# Patient Record
Sex: Male | Born: 1975 | Race: Black or African American | Hispanic: No | Marital: Married | Smoking: Never smoker
Health system: Southern US, Community
[De-identification: ages and names within clinical notes are randomized; demographics above are authoritative.]

## PROBLEM LIST (undated history)

## (undated) DIAGNOSIS — I5031 Acute diastolic (congestive) heart failure: Secondary | ICD-10-CM

## (undated) DIAGNOSIS — E8770 Fluid overload, unspecified: Secondary | ICD-10-CM

## (undated) DIAGNOSIS — G4730 Sleep apnea, unspecified: Secondary | ICD-10-CM

## (undated) DIAGNOSIS — E079 Disorder of thyroid, unspecified: Secondary | ICD-10-CM

## (undated) DIAGNOSIS — K219 Gastro-esophageal reflux disease without esophagitis: Secondary | ICD-10-CM

## (undated) DIAGNOSIS — I509 Heart failure, unspecified: Secondary | ICD-10-CM

## (undated) DIAGNOSIS — I10 Essential (primary) hypertension: Secondary | ICD-10-CM

## (undated) DIAGNOSIS — R011 Cardiac murmur, unspecified: Secondary | ICD-10-CM

## (undated) DIAGNOSIS — I519 Heart disease, unspecified: Secondary | ICD-10-CM

## (undated) HISTORY — DX: Heart failure, unspecified: I50.9

## (undated) HISTORY — DX: Sleep apnea, unspecified: G47.30

## (undated) HISTORY — DX: Cardiac murmur, unspecified: R01.1

## (undated) HISTORY — PX: OTHER SURGICAL HISTORY: SHX169

## (undated) HISTORY — DX: Fluid overload, unspecified: E87.70

## (undated) HISTORY — DX: Gastro-esophageal reflux disease without esophagitis: K21.9

## (undated) HISTORY — DX: Acute diastolic (congestive) heart failure: I50.31

## (undated) HISTORY — DX: Heart disease, unspecified: I51.9

---

## 1998-07-17 ENCOUNTER — Emergency Department (HOSPITAL_COMMUNITY): Admission: EM | Admit: 1998-07-17 | Discharge: 1998-07-17 | Payer: Self-pay | Admitting: Emergency Medicine

## 1999-08-21 ENCOUNTER — Encounter: Payer: Self-pay | Admitting: Emergency Medicine

## 1999-08-21 ENCOUNTER — Emergency Department (HOSPITAL_COMMUNITY): Admission: EM | Admit: 1999-08-21 | Discharge: 1999-08-21 | Payer: Self-pay | Admitting: Emergency Medicine

## 1999-08-23 ENCOUNTER — Emergency Department (HOSPITAL_COMMUNITY): Admission: EM | Admit: 1999-08-23 | Discharge: 1999-08-23 | Payer: Self-pay | Admitting: Emergency Medicine

## 1999-09-02 ENCOUNTER — Emergency Department (HOSPITAL_COMMUNITY): Admission: EM | Admit: 1999-09-02 | Discharge: 1999-09-02 | Payer: Self-pay | Admitting: Emergency Medicine

## 2006-03-31 ENCOUNTER — Emergency Department (HOSPITAL_COMMUNITY): Admission: EM | Admit: 2006-03-31 | Discharge: 2006-03-31 | Payer: Self-pay | Admitting: Emergency Medicine

## 2010-12-05 ENCOUNTER — Encounter: Payer: Self-pay | Admitting: Internal Medicine

## 2011-01-26 ENCOUNTER — Emergency Department (HOSPITAL_COMMUNITY)
Admission: EM | Admit: 2011-01-26 | Discharge: 2011-01-26 | Disposition: A | Discharge status: Home / Self Care | Payer: Worker's Compensation | Attending: Emergency Medicine | Admitting: Emergency Medicine

## 2011-01-26 ENCOUNTER — Emergency Department (HOSPITAL_COMMUNITY): Payer: Worker's Compensation

## 2011-01-26 DIAGNOSIS — Y990 Civilian activity done for income or pay: Secondary | ICD-10-CM | POA: Insufficient documentation

## 2011-01-26 DIAGNOSIS — Y9269 Other specified industrial and construction area as the place of occurrence of the external cause: Secondary | ICD-10-CM | POA: Insufficient documentation

## 2011-01-26 DIAGNOSIS — W208XXA Other cause of strike by thrown, projected or falling object, initial encounter: Secondary | ICD-10-CM | POA: Insufficient documentation

## 2011-01-26 DIAGNOSIS — S90129A Contusion of unspecified lesser toe(s) without damage to nail, initial encounter: Secondary | ICD-10-CM | POA: Insufficient documentation

## 2014-07-17 ENCOUNTER — Other Ambulatory Visit (HOSPITAL_COMMUNITY): Payer: Self-pay | Admitting: Internal Medicine

## 2016-03-21 ENCOUNTER — Emergency Department (HOSPITAL_COMMUNITY): Payer: Medicaid Other

## 2016-03-21 ENCOUNTER — Inpatient Hospital Stay (HOSPITAL_COMMUNITY)
Admission: EM | Admit: 2016-03-21 | Discharge: 2016-03-26 | DRG: 308 | Disposition: A | Discharge status: Home / Self Care | Payer: Medicaid Other | Attending: Internal Medicine | Admitting: Internal Medicine

## 2016-03-21 ENCOUNTER — Inpatient Hospital Stay (HOSPITAL_COMMUNITY): Payer: Medicaid Other

## 2016-03-21 ENCOUNTER — Encounter (HOSPITAL_COMMUNITY): Payer: Self-pay | Admitting: Emergency Medicine

## 2016-03-21 ENCOUNTER — Encounter (HOSPITAL_COMMUNITY): Payer: Self-pay | Admitting: *Deleted

## 2016-03-21 ENCOUNTER — Ambulatory Visit (HOSPITAL_COMMUNITY)
Admission: EM | Admit: 2016-03-21 | Discharge: 2016-03-21 | Disposition: A | Discharge status: Home / Self Care | Payer: Self-pay | Attending: Emergency Medicine | Admitting: Emergency Medicine

## 2016-03-21 DIAGNOSIS — Z9114 Patient's other noncompliance with medication regimen: Secondary | ICD-10-CM

## 2016-03-21 DIAGNOSIS — E213 Hyperparathyroidism, unspecified: Secondary | ICD-10-CM | POA: Diagnosis present

## 2016-03-21 DIAGNOSIS — E0590 Thyrotoxicosis, unspecified without thyrotoxic crisis or storm: Secondary | ICD-10-CM | POA: Diagnosis present

## 2016-03-21 DIAGNOSIS — I5033 Acute on chronic diastolic (congestive) heart failure: Secondary | ICD-10-CM | POA: Diagnosis not present

## 2016-03-21 DIAGNOSIS — R0602 Shortness of breath: Secondary | ICD-10-CM

## 2016-03-21 DIAGNOSIS — I480 Paroxysmal atrial fibrillation: Secondary | ICD-10-CM | POA: Diagnosis present

## 2016-03-21 DIAGNOSIS — I10 Essential (primary) hypertension: Secondary | ICD-10-CM | POA: Insufficient documentation

## 2016-03-21 DIAGNOSIS — E876 Hypokalemia: Secondary | ICD-10-CM | POA: Diagnosis not present

## 2016-03-21 DIAGNOSIS — I5022 Chronic systolic (congestive) heart failure: Secondary | ICD-10-CM | POA: Diagnosis not present

## 2016-03-21 DIAGNOSIS — I481 Persistent atrial fibrillation: Secondary | ICD-10-CM | POA: Diagnosis not present

## 2016-03-21 DIAGNOSIS — R17 Unspecified jaundice: Secondary | ICD-10-CM | POA: Diagnosis present

## 2016-03-21 DIAGNOSIS — I5043 Acute on chronic combined systolic (congestive) and diastolic (congestive) heart failure: Secondary | ICD-10-CM | POA: Diagnosis present

## 2016-03-21 DIAGNOSIS — I509 Heart failure, unspecified: Secondary | ICD-10-CM

## 2016-03-21 DIAGNOSIS — I4891 Unspecified atrial fibrillation: Secondary | ICD-10-CM | POA: Diagnosis not present

## 2016-03-21 DIAGNOSIS — I5040 Unspecified combined systolic (congestive) and diastolic (congestive) heart failure: Secondary | ICD-10-CM | POA: Diagnosis not present

## 2016-03-21 DIAGNOSIS — E8770 Fluid overload, unspecified: Secondary | ICD-10-CM | POA: Diagnosis not present

## 2016-03-21 DIAGNOSIS — Z8679 Personal history of other diseases of the circulatory system: Secondary | ICD-10-CM | POA: Diagnosis present

## 2016-03-21 DIAGNOSIS — I5031 Acute diastolic (congestive) heart failure: Secondary | ICD-10-CM | POA: Diagnosis not present

## 2016-03-21 DIAGNOSIS — I519 Heart disease, unspecified: Secondary | ICD-10-CM

## 2016-03-21 DIAGNOSIS — I110 Hypertensive heart disease with heart failure: Secondary | ICD-10-CM | POA: Diagnosis present

## 2016-03-21 DIAGNOSIS — R079 Chest pain, unspecified: Secondary | ICD-10-CM

## 2016-03-21 HISTORY — DX: Heart failure, unspecified: I50.9

## 2016-03-21 HISTORY — DX: Disorder of thyroid, unspecified: E07.9

## 2016-03-21 HISTORY — DX: Essential (primary) hypertension: I10

## 2016-03-21 LAB — CBC WITH DIFFERENTIAL/PLATELET
Basophils Absolute: 0 10*3/uL (ref 0.0–0.1)
Basophils Relative: 0 %
EOS PCT: 1 %
Eosinophils Absolute: 0 10*3/uL (ref 0.0–0.7)
HEMATOCRIT: 36.8 % — AB (ref 39.0–52.0)
Hemoglobin: 12.5 g/dL — ABNORMAL LOW (ref 13.0–17.0)
LYMPHS ABS: 1.9 10*3/uL (ref 0.7–4.0)
LYMPHS PCT: 23 %
MCH: 24.6 pg — AB (ref 26.0–34.0)
MCHC: 34 g/dL (ref 30.0–36.0)
MCV: 72.3 fL — AB (ref 78.0–100.0)
Monocytes Absolute: 1.3 10*3/uL — ABNORMAL HIGH (ref 0.1–1.0)
Monocytes Relative: 15 %
NEUTROS ABS: 5.1 10*3/uL (ref 1.7–7.7)
Neutrophils Relative %: 61 %
PLATELETS: 187 10*3/uL (ref 150–400)
RBC: 5.09 MIL/uL (ref 4.22–5.81)
RDW: 16.1 % — ABNORMAL HIGH (ref 11.5–15.5)
WBC: 8.3 10*3/uL (ref 4.0–10.5)

## 2016-03-21 LAB — COMPREHENSIVE METABOLIC PANEL
ALK PHOS: 223 U/L — AB (ref 38–126)
ALT: 24 U/L (ref 17–63)
AST: 28 U/L (ref 15–41)
Albumin: 3.2 g/dL — ABNORMAL LOW (ref 3.5–5.0)
Anion gap: 11 (ref 5–15)
BILIRUBIN TOTAL: 4.7 mg/dL — AB (ref 0.3–1.2)
BUN: 9 mg/dL (ref 6–20)
CALCIUM: 8.8 mg/dL — AB (ref 8.9–10.3)
CHLORIDE: 106 mmol/L (ref 101–111)
CO2: 21 mmol/L — ABNORMAL LOW (ref 22–32)
CREATININE: 0.72 mg/dL (ref 0.61–1.24)
Glucose, Bld: 107 mg/dL — ABNORMAL HIGH (ref 65–99)
Potassium: 3.4 mmol/L — ABNORMAL LOW (ref 3.5–5.1)
Sodium: 138 mmol/L (ref 135–145)
TOTAL PROTEIN: 7.2 g/dL (ref 6.5–8.1)

## 2016-03-21 LAB — I-STAT TROPONIN, ED: TROPONIN I, POC: 0.01 ng/mL (ref 0.00–0.08)

## 2016-03-21 LAB — TSH: TSH: <0.01 u[IU]/mL — ABNORMAL LOW (ref 0.350–4.500)

## 2016-03-21 LAB — PROTIME-INR
INR: 1.46 (ref 0.00–1.49)
Prothrombin Time: 17.8 seconds — ABNORMAL HIGH (ref 11.6–15.2)

## 2016-03-21 LAB — CK: Total CK: 63 U/L (ref 49–397)

## 2016-03-21 LAB — BRAIN NATRIURETIC PEPTIDE: B Natriuretic Peptide: 279.4 pg/mL — ABNORMAL HIGH (ref 0.0–100.0)

## 2016-03-21 LAB — TROPONIN I

## 2016-03-21 MED ORDER — METHIMAZOLE 10 MG PO TABS
10.0000 mg | ORAL_TABLET | Freq: Every day | ORAL | Status: DC
  Administered 2016-03-22 – 2016-03-26 (×5): 10 mg via ORAL
  Filled 2016-03-21 (×5): qty 1

## 2016-03-21 MED ORDER — ONDANSETRON HCL 4 MG/2ML IJ SOLN: 4.0000 mg | Freq: Four times a day (QID) | INTRAMUSCULAR | Status: DC | PRN

## 2016-03-21 MED ORDER — DILTIAZEM HCL 100 MG IV SOLR
5.0000 mg/h | Freq: Once | INTRAVENOUS | Status: AC
  Administered 2016-03-21: 5 mg/h via INTRAVENOUS
  Filled 2016-03-21: qty 100

## 2016-03-21 MED ORDER — DILTIAZEM HCL 25 MG/5ML IV SOLN
20.0000 mg | Freq: Once | INTRAVENOUS | Status: AC
  Administered 2016-03-21: 20 mg via INTRAVENOUS
  Filled 2016-03-21: qty 5

## 2016-03-21 MED ORDER — SODIUM CHLORIDE 0.9% FLUSH
3.0000 mL | Freq: Two times a day (BID) | INTRAVENOUS | Status: DC
  Administered 2016-03-22 – 2016-03-26 (×7): 3 mL via INTRAVENOUS

## 2016-03-21 MED ORDER — SODIUM CHLORIDE 0.9% FLUSH: 3.0000 mL | INTRAVENOUS | Status: DC | PRN

## 2016-03-21 MED ORDER — DILTIAZEM HCL 100 MG IV SOLR
5.0000 mg/h | INTRAVENOUS | Status: DC
  Administered 2016-03-22 (×2): 15 mg/h via INTRAVENOUS
  Filled 2016-03-21 (×2): qty 100

## 2016-03-21 MED ORDER — SODIUM CHLORIDE 0.9 % IV SOLN: 250.0000 mL | INTRAVENOUS | Status: DC | PRN

## 2016-03-21 MED ORDER — FUROSEMIDE 10 MG/ML IJ SOLN
40.0000 mg | Freq: Two times a day (BID) | INTRAMUSCULAR | Status: DC
  Administered 2016-03-22 – 2016-03-24 (×5): 40 mg via INTRAVENOUS
  Filled 2016-03-21 (×5): qty 4

## 2016-03-21 MED ORDER — SODIUM CHLORIDE 0.9 % IV BOLUS (SEPSIS): 1000.0000 mL | Freq: Once | INTRAVENOUS | Status: DC

## 2016-03-21 MED ORDER — ATENOLOL 25 MG PO TABS
25.0000 mg | ORAL_TABLET | Freq: Every day | ORAL | Status: DC
  Administered 2016-03-22: 25 mg via ORAL
  Filled 2016-03-21: qty 1

## 2016-03-21 MED ORDER — ACETAMINOPHEN 325 MG PO TABS: 650.0000 mg | ORAL_TABLET | ORAL | Status: DC | PRN

## 2016-03-21 MED ORDER — RAMIPRIL 1.25 MG PO CAPS
1.2500 mg | ORAL_CAPSULE | Freq: Every day | ORAL | Status: DC
  Administered 2016-03-23 – 2016-03-26 (×4): 1.25 mg via ORAL
  Filled 2016-03-21 (×5): qty 1

## 2016-03-21 NOTE — ED Provider Notes (Signed)
CSN: [id number]     Arrival date & time 03/21/16  1654 History   None    Chief Complaint  Patient presents with  . Cough   (Consider location/radiation/quality/duration/timing/severity/associated sxs/prior Treatment) HPI Comments: Patient has run out of thyroid meds for hyperthyroidism a month ago and he is having SOB, tacycardia, swelling in lower extremities, chest pain, and weakness.  Patient is a 40 y.o. male presenting with cough. The history is provided by the patient.  Cough Cough characteristics:  Hacking Severity:  Moderate Onset quality:  Sudden Duration:  1 day Timing:  Constant Progression:  Worsening Chronicity:  New Relieved by:  Nothing Worsened by:  Nothing tried Ineffective treatments:  None tried Associated symptoms: chest pain     History reviewed. No pertinent past medical history. History reviewed. No pertinent past surgical history. No family history on file. Social History  Substance Use Topics  . Smoking status: None  . Smokeless tobacco: None  . Alcohol Use: None    Review of Systems  Constitutional: Negative.   HENT: Negative.   Eyes: Negative.   Respiratory: Positive for cough.   Cardiovascular: Positive for chest pain.  Gastrointestinal: Negative.   Endocrine: Negative.   Genitourinary: Negative.   Musculoskeletal: Negative.   Skin: Negative.   Allergic/Immunologic: Negative.   Neurological: Negative.   Hematological: Negative.   Psychiatric/Behavioral: Negative.     Allergies  Review of patient's allergies indicates no known allergies.  Home Medications   Prior to Admission medications   Medication Sig Start Date End Date Taking? Authorizing Provider  ATENOLOL PO Take by mouth.   Yes Historical Provider, MD   Meds Ordered and Administered this Visit  Medications - No data to display  There were no vitals taken for this visit. No data found.   Physical Exam  Constitutional: He appears well-developed and well-nourished.   HENT:  Head: Normocephalic and atraumatic.  Eyes: Conjunctivae are normal. Pupils are equal, round, and reactive to light.  Neck:  Bilateral JVD 1/2 way up  Cardiovascular:  Tachycardia  Pulmonary/Chest: He is in respiratory distress.  Abdominal: Soft.    ED Course  Procedures (including critical care time)  Labs Review Labs Reviewed - No data to display  Imaging Review No results found.   Visual Acuity Review  Right Eye Distance:   Left Eye Distance:   Bilateral Distance:    Right Eye Near:   Left Eye Near:    Bilateral Near:         MDM  Atrial Fibrillation with rapid ventricular rate - Patient is symptomatic and SOB.  Will add nasal cannula  Oxygen, apply monitor, IV access and transfer to ED.  Chest pain - Transfer to ED via ambulance ASAP    Deatra CanterWilliam J Hilaria Titsworth, FNP 03/21/16 1939  Deatra CanterWilliam J Maryjane Benedict, FNP 03/21/16 1940

## 2016-03-21 NOTE — ED Notes (Addendum)
Pt  Reports    Cough  Congested   Swelling  Of  The  Left  Foot   Pt  Reports  Get  Short  Of  Breath  When   On  Exertion  Pt  Reports    Pt  States         Pt  states     He  Is  Out  Of  His  thyriod          meds   And         Needs  thyriod  Surgery   PT  Reports  Some  jvd   Distension

## 2016-03-21 NOTE — ED Notes (Signed)
Patient denies any dizziness, chest pain, or sob. Pt aware of poc. HR 160s afib. Patient on monitor.

## 2016-03-21 NOTE — ED Notes (Addendum)
Pt went to urgent care today c/o SOB, pt reports he has been out of his hyperthyroid medication for a month.  He came in A-fib w/ RVR, some pedal edema.  At this time he denies SOB, N/V, dizziness.  SOB upon exertion.  Reports loose stool 2X weeks

## 2016-03-21 NOTE — ED Notes (Signed)
Admitting MD at the bedside.  

## 2016-03-21 NOTE — ED Provider Notes (Signed)
CSN: [id number]     Arrival date & time 03/21/16  2000 History   First MD Initiated Contact with Patient 03/21/16 2007     Chief Complaint  Patient presents with  . Shortness of Breath     (Consider location/radiation/quality/duration/timing/severity/associated sxs/prior Treatment) The history is provided by the patient.  KEELAN TRIPODI is a 40 y.o. male hx of hyperthyroidism, HTN here with shortness of breath, palpitations. Patient has not been taking his methimazole and atenolol for the last month or so. About a month ago he had an episode of palpitations have resolved. For the last week, he's been having constant palpitations and shortness of breath. Palpitations is worse when he exerts himself. Denies any chest pain at rest. Also has loose stools for 2 weeks, denies watery diarrhea or abdominal pain or fevers. Also has worsening bilateral leg swelling as well. Went to urgent care, sent for new onset afib.    Past Medical History  Diagnosis Date  . Thyroid disease   . Hypertension    History reviewed. No pertinent past surgical history. History reviewed. No pertinent family history. Social History  Substance Use Topics  . Smoking status: Never Smoker   . Smokeless tobacco: None  . Alcohol Use: No    Review of Systems  Respiratory: Positive for shortness of breath.   Cardiovascular: Positive for palpitations.  All other systems reviewed and are negative.     Allergies  Review of patient's allergies indicates no known allergies.  Home Medications   Prior to Admission medications   Medication Sig Start Date End Date Taking? Authorizing Provider  atenolol (TENORMIN) 25 MG tablet Take 25 mg by mouth daily.   Yes Historical Provider, MD  ibuprofen (ADVIL,MOTRIN) 200 MG tablet Take 200 mg by mouth every 6 (six) hours as needed for headache.   Yes Historical Provider, MD  methimazole (TAPAZOLE) 10 MG tablet Take 10 mg by mouth daily.   Yes Historical Provider, MD   Phenylephrine-APAP-Guaifenesin (MUCINEX FAST-MAX) 10-650-400 MG/20ML LIQD Take 30 mLs by mouth daily as needed (FOR COLD).   Yes Historical Provider, MD   BP 163/97 mmHg  Pulse 112  Resp 30  SpO2 98% Physical Exam  Constitutional: He is oriented to person, place, and time.  Uncomfortable   HENT:  Head: Normocephalic.  Mouth/Throat: Oropharynx is clear and moist.  Eyes: Conjunctivae are normal. Pupils are equal, round, and reactive to light.  Neck: Normal range of motion. Neck supple.  Cardiovascular:  Tachy, irregular   Pulmonary/Chest: Effort normal and breath sounds normal. No respiratory distress. He has no wheezes. He has no rales.  Abdominal: Soft. Bowel sounds are normal. He exhibits no distension. There is no tenderness. There is no rebound.  Musculoskeletal: Normal range of motion.  1+ edema bilaterally, no calf tenderness   Neurological: He is alert and oriented to person, place, and time.  Skin: Skin is warm and dry.  Psychiatric: He has a normal mood and affect. His behavior is normal. Judgment and thought content normal.  Nursing note and vitals reviewed.   ED Course  Procedures (including critical care time)  CRITICAL CARE Performed by: Darl Householder, Deyton Ellenbecker   Total critical care time: 30 minutes  Critical care time was exclusive of separately billable procedures and treating other patients.  Critical care was necessary to treat or prevent imminent or life-threatening deterioration.  Critical care was time spent personally by me on the following activities: development of treatment plan with patient and/or surrogate as well as nursing,  discussions with consultants, evaluation of patient's response to treatment, examination of patient, obtaining history from patient or surrogate, ordering and performing treatments and interventions, ordering and review of laboratory studies, ordering and review of radiographic studies, pulse oximetry and re-evaluation of patient's  condition.   Labs Review Labs Reviewed  CBC WITH DIFFERENTIAL/PLATELET - Abnormal; Notable for the following:    Hemoglobin 12.5 (*)    HCT 36.8 (*)    MCV 72.3 (*)    MCH 24.6 (*)    RDW 16.1 (*)    Monocytes Absolute 1.3 (*)    All other components within normal limits  COMPREHENSIVE METABOLIC PANEL - Abnormal; Notable for the following:    Potassium 3.4 (*)    CO2 21 (*)    Glucose, Bld 107 (*)    Calcium 8.8 (*)    Albumin 3.2 (*)    Alkaline Phosphatase 223 (*)    Total Bilirubin 4.7 (*)    All other components within normal limits  BRAIN NATRIURETIC PEPTIDE - Abnormal; Notable for the following:    B Natriuretic Peptide 279.4 (*)    All other components within normal limits  TSH  CK  PROTIME-INR  I-STAT TROPOININ, ED    Imaging Review No results found. I have personally reviewed and evaluated these images and lab results as part of my medical decision-making.   EKG Interpretation   Date/Time:  Monday Mar 21 2016 20:03:34 EDT Ventricular Rate:  166 PR Interval:    QRS Duration: 92 QT Interval:  277 QTC Calculation: 460 R Axis:   13 Text Interpretation:  Atrial fibrillation Paired ventricular premature  complexes Consider anterior infarct Nonspecific T abnormalities, lateral  leads rapid afib new since previous  Confirmed by Barak Bialecki  MD, Wessley Emert ([id number])  on 03/21/2016 8:13:14 PM      MDM   Final diagnoses:  Elevated bilirubin   ULISES WOLFINGER is a 40 y.o. male here with palpitations, shortness of breath. Has new onset afib likely from uncompliance from his thyroid medication. Came in rapid afib rate 150-180s. Will give cardizem bolus and reassess. Also can be in heart failure so will get BNP and TSH as well.    9:45 PM Given cardizem bolus, still tachy in 140s. Started cardizem drip. Labs showed BNP 279. Also elevated bilirubin and Alk Phos. Hospitalist wants RUQ [place]. Will admit to stepdown.     Wandra Arthurs, MD 03/21/16 2145

## 2016-03-21 NOTE — ED Notes (Signed)
PLACED  ON  CARDIAC    MONITOR   NASAL  O2  AT  2  L  /  MIN

## 2016-03-21 NOTE — H&P (Signed)
Troy Morris UJW:[id number] DOB: 1976/04/10 DOA: 03/21/2016   Referring MD Silverio LayYao PCP: Lonia BloodGARBA,LAWAL, MD   Outpatient Specialists: none Patient coming from: home  Chief Complaint: Palpitations  HPI: Troy Morris is a 40 y.o. male with medical history significant of hyperthyroidism    Presented with 1 day of cough hacking and 2-3 of  palpitations lasting for the past  Week getting continuous now. Associated shortness of breath and dyspnea on exertion. Reports orthopnea  and bilateral lower extremity swelling worse since yesterday this has been associated with some chest pain that started 3 days ago coming and going sharp pain radiating to left arm pain in small area of the left chest. 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 Today presented to urgent care was found to be tachycardic and sent to  emergency department. Denies alcohol abuse, no use of tylenol he have used some ibuprofen. Denies any fever or chills now but in march he had some fevers. Reports diarrhea for 2 weeks and loosing body mass on upper body but increased fluids on the legs   Regarding pertinent Chronic problems: History of hyperparathyroidism used on methemazole and atenolol but run out of his medications.    IN ER: Heart rate 155 respirations 30 WBC 8.3 hemoglobin 12.5 potassium 3.4 INR 1.46 and PO2 79 TSH less than 0.01. Elevated 5.7 alkaline phosphatase 223 albumin 3.2 AST and ALT within normal limits  EKG showed atrial fibrillation with RVR  Afebrile initial heart rate up to 155 was given a dose of diltiazem IV and  started on diltiazem drip.  Chest XA showing mild congestive heart failure with small bilateral pleural effusions   Hospitalist was called for admission for atrial fibrillation with RVR associated with heart failure active secondary to persistent tachycardia secondary to untreated hyperthyroidism  Review of Systems:     Pertinent positives include: fatigue, weight gain, chest pain, Orthopnea, Bilateral lower extremity swelling   Constitutional:  No weight loss, night sweats, Fevers, chills,  weight loss  HEENT:  No headaches, Difficulty swallowing,Tooth/dental problems,Sore throat,  No sneezing, itching, ear ache, nasal congestion, post nasal drip,  Cardio-vascular:  No PND, anasarca, dizziness, palpitations  GI:  No heartburn, indigestion, abdominal pain, nausea, vomiting, diarrhea, change in bowel habits, loss of appetite, melena, blood in stool, hematemesis Resp:  no shortness of breath at rest. No dyspnea on exertion, No excess mucus, no productive cough, No non-productive cough, No coughing up of blood.No change in color of mucus.No wheezing. Skin:  no rash or lesions. No jaundice GU:  no dysuria, change in color of urine, no urgency or frequency. No straining to urinate.  No flank pain.  Musculoskeletal:  No joint pain or no joint swelling. No decreased range of motion. No back pain.  Psych:  No change in mood or affect. No depression or anxiety. No memory loss.  Neuro: no localizing neurological complaints, no tingling, no weakness, no double vision, no gait abnormality, no slurred speech, no confusion  As per HPI otherwise 10 point review of systems negative.   Past Medical History: Past Medical History  Diagnosis Date  . Thyroid disease   . Hypertension    History reviewed. No pertinent past surgical history.   Social History:  Ambulatory   Independently  Lives at home   With family     reports that he has never smoked. He does not have any smokeless  tobacco history on file. He reports that he does not drink alcohol or use illicit drugs.  Allergies:  No Known Allergies     Family History:    Family History  Problem Relation Age of Onset  . Diabetes Mother   . CAD Neg Hx   . Stroke Neg Hx   . Cancer Neg Hx   . Clotting disorder Neg Hx     Medications: Prior to  Admission medications   Medication Sig Start Date End Date Taking? Authorizing Provider  atenolol (TENORMIN) 25 MG tablet Take 25 mg by mouth daily.   Yes Historical Provider, MD  ibuprofen (ADVIL,MOTRIN) 200 MG tablet Take 200 mg by mouth every 6 (six) hours as needed for headache.   Yes Historical Provider, MD  methimazole (TAPAZOLE) 10 MG tablet Take 10 mg by mouth daily.   Yes Historical Provider, MD  Phenylephrine-APAP-Guaifenesin (MUCINEX FAST-MAX) 10-650-400 MG/20ML LIQD Take 30 mLs by mouth daily as needed (FOR COLD).   Yes Historical Provider, MD    Physical Exam: Patient Vitals for the past 24 hrs:  BP Pulse Resp SpO2  03/21/16 2100 163/97 mmHg 112 (!) 30 98 %  03/21/16 2045 (!) 159/116 mmHg 103 (!) 30 99 %  03/21/16 2030 (!) 153/111 mmHg 120 26 97 %  03/21/16 2011 (!) 155/106 mmHg (!) 130 21 100 %  03/21/16 2005 - - - 100 %    1. General:  in No Acute distress 2. Psychological: Alert and   Oriented 3. Head/ENT:    Dry Mucous Membranes                          Head Non traumatic, neck supple                          Normal   Dentition 4. SKIN:  decreased Skin turgor,  Skin clean Dry and intact no rash, hot 5. Heart: Regular rate and rhythm no  Murmur, Rub or gallop 6. Lungs:   no wheezes mild crackles   7. Abdomen: Soft, non-tender, Non distended 8. Lower extremities: no clubbing, cyanosis, 2+ edema bilateral 9. Neurologically Grossly intact, moving all 4 extremities equally 10. MSK: Normal range of motion   body mass index is unknown because there is no height or weight on file.  Labs on Admission:   Labs on Admission: I have personally reviewed following labs and imaging studies  CBC:  Recent Labs Lab 03/21/16 2019  WBC 8.3  NEUTROABS 5.1  HGB 12.5*  HCT 36.8*  MCV 72.3*  PLT 187   Basic Metabolic Panel:  Recent Labs Lab 03/21/16 2019  NA 138  K 3.4*  CL 106  CO2 21*  GLUCOSE 107*  BUN 9  CREATININE 0.72  CALCIUM 8.8*   GFR: CrCl cannot be  calculated (Unknown ideal weight.). Liver Function Tests:  Recent Labs Lab 03/21/16 2019  AST 28  ALT 24  ALKPHOS 223*  BILITOT 4.7*  PROT 7.2  ALBUMIN 3.2*   No results for input(s): LIPASE, AMYLASE in the last 168 hours. No results for input(s): AMMONIA in the last 168 hours. Coagulation Profile: No results for input(s): INR, PROTIME in the last 168 hours. Cardiac Enzymes: No results for input(s): CKTOTAL, CKMB, CKMBINDEX, TROPONINI in the last 168 hours. BNP (last 3 results) No results for input(s): PROBNP in the last 8760 hours. HbA1C: No results for input(s): HGBA1C in the last 72 hours.  CBG: No results for input(s): GLUCAP in the last 168 hours. Lipid Profile: No results for input(s): CHOL, HDL, LDLCALC, TRIG, CHOLHDL, LDLDIRECT in the last 72 hours. Thyroid Function Tests: No results for input(s): TSH, T4TOTAL, FREET4, T3FREE, THYROIDAB in the last 72 hours. Anemia Panel: No results for input(s): VITAMINB12, FOLATE, FERRITIN, TIBC, IRON, RETICCTPCT in the last 72 hours. Urine analysis: No results found for: COLORURINE, APPEARANCEUR, LABSPEC, PHURINE, GLUCOSEU, HGBUR, BILIRUBINUR, KETONESUR, PROTEINUR, UROBILINOGEN, NITRITE, LEUKOCYTESUR Sepsis Labs: @LABRCNTIP (procalcitonin:4,lacticidven:4) )No results found for this or any previous visit (from the past 240 hour(s)).     UA not obtained  No results found for: HGBA1C  CrCl cannot be calculated (Unknown ideal weight.).  BNP (last 3 results) No results for input(s): PROBNP in the last 8760 hours.   ECG REPORT  Independently reviewed Rate: 175  Rhythm: a.fib w RVR ST&T Change: No acute ischemic changes   QTC 494  There were no vitals filed for this visit.   Cultures: No results found for: SDES, SPECREQUEST, CULT, REPTSTATUS   Radiological Exams on Admission: Dg Chest Port 1 View  03/21/2016  CLINICAL DATA:  Shortness breath and tachycardia for 1 day. Atrial fibrillation with rapid ventricular response.  EXAM: PORTABLE CHEST 1 VIEW COMPARISON:  None. FINDINGS: Moderate to severe cardiomegaly noted. Mild diffuse interstitial infiltrates seen, consistent with interstitial edema. No focal consolidation identified. Small bilateral pleural effusions also noted. IMPRESSION: Mild congestive heart failure with small bilateral pleural effusions. Electronically Signed   By: Myles Rosenthal M.D.   On: 03/21/2016 21:43   US Abdomen Limited Ruq  03/21/2016  CLINICAL DATA:  40 year old male with elevated bilirubin EXAM: US ABDOMEN LIMITED - RIGHT UPPER QUADRANT COMPARISON:  None. FINDINGS: Gallbladder: No gallstones or wall thickening visualized. No sonographic Murphy sign noted by sonographer. Common bile duct: Diameter: 3 mm Liver: Minimal increased hepatic echotexture. A right-sided pleural effusions partially visualized. IMPRESSION: Minimally increased hepatic echotexture otherwise unremarkable right upper quadrant ultrasound. Partially visualized right pleural effusion. Electronically Signed   By: Elgie Collard M.D.   On: 03/21/2016 22:22    Chart has been reviewed    Assessment/Plan  40 y.o. male with medical history significant of hyperthyroidism being admited for atrial fibrillation with RVR associated with heart failure active secondary to persistent tachycardia secondary to untreated hyperthyroidism  Present on Admission:  . Atrial fibrillation with RVR (HCC) -  - Admit to step down on Cardizem drip       CHA2D-VASC score 1 ( hx of HTN) but will likely go up to 2 given possible CHF, echo pending. INR slightly elevated          For right now continue lovenox will make final determination of need for anticoagulation after echo has resulted          Check TSH low consistent with hx of Hyperthyrodism      Cycle cardiac enzymes      Obtain ECHO      Cardiology consult in AM . Elevated bilirubin - will obtain direct and indirect, mild liver abnormalities on [place], possible hepatic conjestion . Hyperthyroidism  - will restart methimazole and atenolol will need follow up with endocrinology, obtain US thyroid    Other plan as per orders.  DVT prophylaxis:    Lovenox     Code Status:  FULL CODE   as per patient   Family Communication:   Family not  at  Bedside    Disposition Plan:    To home once workup  is complete and patient is stable   Consults called: emailed cardiology   Admission status: inpatient       Level of care    SDU      I have spent a total of 57 min on this admission    Jissel Slavens 03/22/2016, 2:12 AM    [facility]  Pager [phone]   after 2 AM please page floor coverage PA If 7AM-7PM, please contact the day team taking care of the patient  [email or web address]  Password [email or web address]

## 2016-03-22 ENCOUNTER — Inpatient Hospital Stay (HOSPITAL_COMMUNITY): Payer: Medicaid Other

## 2016-03-22 ENCOUNTER — Encounter (HOSPITAL_COMMUNITY): Payer: Self-pay | Admitting: Physician Assistant

## 2016-03-22 DIAGNOSIS — E0590 Thyrotoxicosis, unspecified without thyrotoxic crisis or storm: Secondary | ICD-10-CM

## 2016-03-22 DIAGNOSIS — E8770 Fluid overload, unspecified: Secondary | ICD-10-CM | POA: Diagnosis present

## 2016-03-22 DIAGNOSIS — I4891 Unspecified atrial fibrillation: Secondary | ICD-10-CM

## 2016-03-22 DIAGNOSIS — I5033 Acute on chronic diastolic (congestive) heart failure: Secondary | ICD-10-CM

## 2016-03-22 DIAGNOSIS — I509 Heart failure, unspecified: Secondary | ICD-10-CM

## 2016-03-22 HISTORY — DX: Fluid overload, unspecified: E87.70

## 2016-03-22 LAB — URINALYSIS, ROUTINE W REFLEX MICROSCOPIC
GLUCOSE, UA: NEGATIVE mg/dL
KETONES UR: NEGATIVE mg/dL
Leukocytes, UA: NEGATIVE
Nitrite: NEGATIVE
Specific Gravity, Urine: 1.015 (ref 1.005–1.030)
pH: 5 (ref 5.0–8.0)

## 2016-03-22 LAB — COMPREHENSIVE METABOLIC PANEL
ALBUMIN: 3.1 g/dL — AB (ref 3.5–5.0)
ALT: 23 U/L (ref 17–63)
AST: 23 U/L (ref 15–41)
Alkaline Phosphatase: 208 U/L — ABNORMAL HIGH (ref 38–126)
Anion gap: 12 (ref 5–15)
BUN: 11 mg/dL (ref 6–20)
CHLORIDE: 103 mmol/L (ref 101–111)
CO2: 20 mmol/L — ABNORMAL LOW (ref 22–32)
CREATININE: 0.76 mg/dL (ref 0.61–1.24)
Calcium: 8.5 mg/dL — ABNORMAL LOW (ref 8.9–10.3)
GFR calc non Af Amer: >60 mL/min (ref >60)
GLUCOSE: 111 mg/dL — AB (ref 65–99)
Potassium: 3.7 mmol/L (ref 3.5–5.1)
SODIUM: 135 mmol/L (ref 135–145)
Total Bilirubin: 4.8 mg/dL — ABNORMAL HIGH (ref 0.3–1.2)
Total Protein: 6.9 g/dL (ref 6.5–8.1)

## 2016-03-22 LAB — URINE MICROSCOPIC-ADD ON

## 2016-03-22 LAB — TROPONIN I: Troponin I: <0.03 ng/mL (ref <0.031)

## 2016-03-22 LAB — CBC WITH DIFFERENTIAL/PLATELET
Basophils Absolute: 0 10*3/uL (ref 0.0–0.1)
Basophils Relative: 0 %
EOS ABS: 0.1 10*3/uL (ref 0.0–0.7)
Eosinophils Relative: 1 %
HEMATOCRIT: 35.3 % — AB (ref 39.0–52.0)
HEMOGLOBIN: 11.8 g/dL — AB (ref 13.0–17.0)
LYMPHS ABS: 2.3 10*3/uL (ref 0.7–4.0)
Lymphocytes Relative: 28 %
MCH: 23.6 pg — AB (ref 26.0–34.0)
MCHC: 33.4 g/dL (ref 30.0–36.0)
MCV: 70.6 fL — ABNORMAL LOW (ref 78.0–100.0)
MONO ABS: 1.1 10*3/uL — AB (ref 0.1–1.0)
MONOS PCT: 14 %
NEUTROS PCT: 58 %
Neutro Abs: 4.7 10*3/uL (ref 1.7–7.7)
Platelets: 194 10*3/uL (ref 150–400)
RBC: 5 MIL/uL (ref 4.22–5.81)
RDW: 15.9 % — ABNORMAL HIGH (ref 11.5–15.5)
WBC: 8.2 10*3/uL (ref 4.0–10.5)

## 2016-03-22 LAB — PROTIME-INR
INR: 1.53 — ABNORMAL HIGH (ref 0.00–1.49)
Prothrombin Time: 18.4 seconds — ABNORMAL HIGH (ref 11.6–15.2)

## 2016-03-22 LAB — ECHOCARDIOGRAM COMPLETE
Height: 74 in
WEIGHTICAEL: 4110.4 [oz_av]

## 2016-03-22 LAB — MRSA PCR SCREENING: MRSA by PCR: NEGATIVE

## 2016-03-22 LAB — T4, FREE: FREE T4: 5.22 ng/dL — AB (ref 0.61–1.12)

## 2016-03-22 LAB — BILIRUBIN, DIRECT: Bilirubin, Direct: 0.9 mg/dL — ABNORMAL HIGH (ref 0.1–0.5)

## 2016-03-22 LAB — LACTATE DEHYDROGENASE: LDH: 169 U/L (ref 98–192)

## 2016-03-22 MED ORDER — PERFLUTREN LIPID MICROSPHERE
INTRAVENOUS | Status: AC
  Administered 2016-03-22: 2 mL
  Filled 2016-03-22: qty 10

## 2016-03-22 MED ORDER — ENOXAPARIN SODIUM 120 MG/0.8ML ~~LOC~~ SOLN
115.0000 mg | Freq: Two times a day (BID) | SUBCUTANEOUS | Status: DC
  Administered 2016-03-22: 115 mg via SUBCUTANEOUS
  Filled 2016-03-22: qty 0.77

## 2016-03-22 MED ORDER — RIVAROXABAN 20 MG PO TABS
20.0000 mg | ORAL_TABLET | Freq: Every day | ORAL | Status: DC
  Administered 2016-03-22 – 2016-03-25 (×4): 20 mg via ORAL
  Filled 2016-03-22 (×4): qty 1

## 2016-03-22 MED ORDER — METOPROLOL TARTRATE 25 MG PO TABS
25.0000 mg | ORAL_TABLET | Freq: Two times a day (BID) | ORAL | Status: DC
  Administered 2016-03-22 – 2016-03-23 (×2): 25 mg via ORAL
  Filled 2016-03-22 (×2): qty 1

## 2016-03-22 MED ORDER — POTASSIUM CHLORIDE CRYS ER 20 MEQ PO TBCR
40.0000 meq | EXTENDED_RELEASE_TABLET | Freq: Once | ORAL | Status: AC
  Administered 2016-03-22: 40 meq via ORAL
  Filled 2016-03-22: qty 2

## 2016-03-22 MED ORDER — ENOXAPARIN SODIUM 120 MG/0.8ML ~~LOC~~ SOLN
115.0000 mg | SUBCUTANEOUS | Status: AC
  Administered 2016-03-22: 115 mg via SUBCUTANEOUS
  Filled 2016-03-22: qty 0.77

## 2016-03-22 MED ORDER — SODIUM CHLORIDE 0.9 % IV SOLN
Freq: Once | INTRAVENOUS | Status: AC
  Administered 2016-03-22: 11:00:00 via INTRAVENOUS

## 2016-03-22 NOTE — Consult Note (Signed)
CARDIOLOGY CONSULT NOTE   Patient ID: Troy Morris MRN: [id number], DOB/AGE: 42-Jun-1977   Admit date: 03/21/2016 Date of Consult: 03/22/2016   Primary Physician: Lonia Blood, MD Primary Cardiologist: new  Pt. Profile  Troy Morris is a 40 year old AA male with past medical history of hypertension and hyperthyroidism presented with increasing SOB, cough and LE edema after stopping his methimazole for a month. He was found to be in afib with RVR with HR 150s  Problem List  Past Medical History  Diagnosis Date  . Thyroid disease   . Hypertension     Past Surgical History  Procedure Laterality Date  . Steel Nurse, adult      [id number], after football facial injury from collision     Allergies  No Known Allergies  HPI   Troy Morris is a 40 year old AA male with past medical history of hypertension and hyperthyroidism. He says he has been diagnosed with hyperthyroidism since 2007 and has been on methimazole and atenolol, however has only been partially compliant with medications. His PCP has recommended radiation therapies in the past for many years, however due to insurance reasons, he has not underwent the procedure. He denies any cardiac history other than a heart murmur and no occasional palpitation. He says he occasionally has sharp left sided chest discomfort that seems to be related to exertion, however only occurs a few seconds at a time. He think the sharp chest discomfort happens when he does not take his medication.  For the past month, he has not taken any methimazole. He says he has been noticing increasing shortness of breath with exertion in the last week. He also has occasional PND episodes as well. For the last 3 days, he began to have mild productive cough and worsening lower extremity edema. He eventually sought medical attention at local urgent care who found him to be tachycardic and that was in atrial fibrillation with RVR and he was sent to the emergency room. He says he  only occasionally notice the palpitation.   Upon arrival in the ED, he was hypertensive with systolic blood pressure in the 150s. Heart rate was in the 130s to 150s. His TSH was severely low at less than 0.01, free T4 was elevated at 5.22. Hemoglobin 12.5. Troponin negative. BNP 279. Creatinine 0.72. Potassium 3.4. Alkaline phosphatase 223, albumin 3.2, AST and ALT normal. He was admitted to hospitalist service given elevated alkaline phosphatase. Chest x-ray shows mild congestive heart failure. Cardiology has been consulted for atrial fibrillation with RVR in the setting of medication noncompliance and the hyperthyroidism.   Inpatient Medications  . atenolol  25 mg Oral Daily  . enoxaparin (LOVENOX) injection  115 mg Subcutaneous Q12H  . furosemide  40 mg Intravenous BID  . methimazole  10 mg Oral Daily  . ramipril  1.25 mg Oral Daily  . sodium chloride flush  3 mL Intravenous Q12H    Family History Family History  Problem Relation Age of Onset  . Diabetes Mother   . CAD Neg Hx   . Stroke Neg Hx   . Cancer Neg Hx   . Clotting disorder Neg Hx      Social History Social History   Social History  . Marital Status: Single    Spouse Name: N/A  . Number of Children: N/A  . Years of Education: N/A   Occupational History  . Not on file.   Social History Main Topics  . Smoking status: Never Smoker   . Smokeless  tobacco: Not on file  . Alcohol Use: No  . Drug Use: No  . Sexual Activity: Not on file   Other Topics Concern  . Not on file   Social History Narrative     Review of Systems  General:  No chills, fever, night sweats or weight changes.  Cardiovascular:  +orthopnea, palpitations, chest pain, dyspnea on exertion, edema, paroxysmal nocturnal dyspnea. Dermatological: No rash, lesions/masses Respiratory: +cough, dyspnea Urologic: No hematuria, dysuria Abdominal:   No nausea, vomiting, diarrhea, bright red blood per rectum, melena, or hematemesis Neurologic:  No  visual changes, wkns, changes in mental status. All other systems reviewed and are otherwise negative except as noted above.  Physical Exam  Blood pressure 157/99, pulse 118, temperature 98.9 F (37.2 C), temperature source Oral, resp. rate 27, height 6\' 2"  (1.88 m), weight 256 lb 14.4 oz (116.529 kg), SpO2 91 %.  General: Pleasant, NAD Psych: Normal affect. Neuro: Alert and oriented X 3. Moves all extremities spontaneously. HEENT: Normal  Neck: Supple without bruits or JVD. Lungs:  Resp regular and unlabored, CTA. Heart: tachycardic, irregular. no s3, s4, or murmurs. Abdomen: Soft, non-tender, non-distended, BS + x 4.  Extremities: No clubbing, cyanosis. DP/PT/Radials 2+ and equal bilaterally. 1+ pitting edema  Labs   Recent Labs  03/21/16 2148 03/21/16 2241 03/22/16 0358  CKTOTAL 63  --   --   TROPONINI  --  <0.03 <0.03   Lab Results  Component Value Date   WBC 8.2 03/22/2016   HGB 11.8* 03/22/2016   HCT 35.3* 03/22/2016   MCV 70.6* 03/22/2016   PLT 194 03/22/2016     Recent Labs Lab 03/22/16 0359  NA 135  K 3.7  CL 103  CO2 20*  BUN 11  CREATININE 0.76  CALCIUM 8.5*  PROT 6.9  BILITOT 4.8*  ALKPHOS 208*  ALT 23  AST 23  GLUCOSE 111*    Radiology/Studies  Dg Chest Port 1 View  03/21/2016  CLINICAL DATA:  Shortness breath and tachycardia for 1 day. Atrial fibrillation with rapid ventricular response. EXAM: PORTABLE CHEST 1 VIEW COMPARISON:  None. FINDINGS: Moderate to severe cardiomegaly noted. Mild diffuse interstitial infiltrates seen, consistent with interstitial edema. No focal consolidation identified. Small bilateral pleural effusions also noted. IMPRESSION: Mild congestive heart failure with small bilateral pleural effusions. Electronically Signed   By: Myles RosenthalJohn  Stahl M.D.   On: 03/21/2016 21:43   [place] Abdomen Limited Ruq  03/21/2016  CLINICAL DATA:  40 year old male with elevated bilirubin EXAM: US ABDOMEN LIMITED - RIGHT UPPER QUADRANT COMPARISON:   None. FINDINGS: Gallbladder: No gallstones or wall thickening visualized. No sonographic Murphy sign noted by sonographer. Common bile duct: Diameter: 3 mm Liver: Minimal increased hepatic echotexture. A right-sided pleural effusions partially visualized. IMPRESSION: Minimally increased hepatic echotexture otherwise unremarkable right upper quadrant ultrasound. Partially visualized right pleural effusion. Electronically Signed   By: Elgie CollardArash  Radparvar M.D.   On: 03/21/2016 22:22    ECG  H fibrillation with RVR.  ASSESSMENT AND PLAN  1. Newly diagnosed afib with RVR in the setting of medication noncompliance and hyperthyroidism  - Has not taken methimazole for over a month.  - CHA2DS2-Vasc score 1 (HTN), given uncontrolled hyperthyroidism, chance of converting by himself is low, will not attempt TEE cardioversion while hyperthyroidism is not controlled. Will discuss with MD, given persistent afib, will need to consider start Xarelto or eliquis. He has not had any bleeding issue, no obvious contraindication to NOAC  - Not sure how much we can  control the heart rate with hyperthyroidism, target HR likely around 110.   - consider change atenolol to metoprolol and uptitrate medication and wean IV diltiazem.  2. Acute on chronic diastolic heart failure in the setting of afib of RVR  - Obtain echo, agree with IV diuresis for now  3. Hyperthyroidism: noncompliant with medication  4. HTN  5. Chest pain: he does have occasional sharp L sided chest pain, unclear if related to afib, if continue to have symptom, may consider myoview later   Ramond Dial, PA-C 03/22/2016, 7:33 AM  Patient examined chart reviewed. Obese black male. Noncompliant with meds. Hyperthyroid resulting In PAF.  Discussed importance of taking methimzole.  Rate control with beta blocker Start NOAC despite Lower CHADVASC score as PAF in setting of hyperthyroidism has higher risk of stroke. Can consider [facility] Once he is  euthryroid.  Charlton Haws

## 2016-03-22 NOTE — Progress Notes (Signed)
Echocardiogram 2D Echocardiogram with Definity has been performed.  Nolon RodBrown, Tony 03/22/2016, 12:33 PM

## 2016-03-22 NOTE — Progress Notes (Signed)
ANTICOAGULATION CONSULT NOTE - Initial Consult  Pharmacy Consult for Lovenox Indication: atrial fibrillation  No Known Allergies  Patient Measurements: Height: 6\' 2"  (188 cm) Weight: 254 lb 6.4 oz (115.395 kg) IBW/kg (Calculated) : 82.2  Vital Signs: Temp: 98.9 F (37.2 C) (05/08 2344) Temp Source: Oral (05/08 2344) BP: 156/102 mmHg (05/08 2344) Pulse Rate: 139 (05/08 2344)  Labs:  Recent Labs  03/21/16 2019 03/21/16 2148 03/21/16 2241  HGB 12.5*  --   --   HCT 36.8*  --   --   PLT 187  --   --   LABPROT  --  17.8*  --   INR  --  1.46  --   CREATININE 0.72  --   --   CKTOTAL  --  63  --   TROPONINI  --   --  <0.03    Estimated Creatinine Clearance: 164.1 mL/min (by C-G formula based on Cr of 0.72).   Medical History: Past Medical History  Diagnosis Date  . Thyroid disease   . Hypertension     Medications:  Prescriptions prior to admission  Medication Sig Dispense Refill Last Dose  . atenolol (TENORMIN) 25 MG tablet Take 25 mg by mouth daily.   Past Month at Unknown time  . ibuprofen (ADVIL,MOTRIN) 200 MG tablet Take 200 mg by mouth every 6 (six) hours as needed for headache.   Past Week at Unknown time  . methimazole (TAPAZOLE) 10 MG tablet Take 10 mg by mouth daily.   Past Month at Unknown time  . Phenylephrine-APAP-Guaifenesin (MUCINEX FAST-MAX) 10-650-400 MG/20ML LIQD Take 30 mLs by mouth daily as needed (FOR COLD).   03/20/2016 at Unknown time   Scheduled:  . atenolol  25 mg Oral Daily  . enoxaparin (LOVENOX) injection  115 mg Subcutaneous NOW  . enoxaparin (LOVENOX) injection  115 mg Subcutaneous Q12H  . furosemide  40 mg Intravenous BID  . methimazole  10 mg Oral Daily  . ramipril  1.25 mg Oral Daily  . sodium chloride flush  3 mL Intravenous Q12H   Infusions:  . diltiazem (CARDIZEM) infusion      Assessment: 40[facility], found to be in Afib w/ RVR and [facility] to begin [facility].  Goal of Therapy:  Anti-Xa  level 0.6-1 units/ml 4hrs after LMWH dose given Monitor platelets by anticoagulation protocol: Yes   Plan:  Will start Lovenox 115mg  SQ Q12H and monitor CBC; f/u long-term anticoag plans (CHA2DS2-vASC = 1).  Vernard GamblesVeronda Dustie Brittle, PharmD, BCPS  03/22/2016,12:13 AM

## 2016-03-22 NOTE — Progress Notes (Signed)
Confirmed with Randall AnBrittany Strader PA to ok to give patient his afternoon xarelto.  Colman Caterarpley, Rainbow Salman Danielle

## 2016-03-22 NOTE — Care Management Note (Addendum)
Case Management Note  Patient Details  Name: Troy Morris MRN: [id number] Date of Birth: 1976-10-24  Subjective/Objective: Pt admitted for Atrial Fib. Pt has PCP with Dr. Mikeal HawthorneGarba. Pt is without insurance. Pt uses [facility] on [address]. Pt was in a procedure and CM not able to speak with pt.                     Action/Plan: Artistinancial Counselor- will contact patient before d/c.  CM will continue to monitor for additional disposition needs.   Expected Discharge Date:                  Expected Discharge Plan:  Home/Self Care  In-House Referral:  Artistinancial Counselor.  Discharge planning Services  [facility] Consult, [facility], Hospital F/u Appointment Scheduled.   Post Acute Care Choice:   N/A Choice offered to:   N/A  DME Arranged:   N/A DME Agency:   N/A  HH Arranged:   N/A HH Agency:   N/A  Status of Service: Completed.  Medicare Important Message Given:    Date Medicare IM Given:    Medicare IM give by:    Date Additional Medicare IM Given:    Additional Medicare Important Message give by:     If discussed at Long Length of Stay Meetings, dates discussed:    Additional Comments: 1449 03-25-16 Tomi BambergerBrenda Graves-Bigelow, RN,BSN [phone] CM assisted with patient assistance forms for Xarelto. Pt has card and information was faxed to company. Pt should hear back within 2-3 weeks. CM did make pt aware that he can call the company to check status. No further needs from CM at this time.   Gala LewandowskyGraves-Bigelow, Akari [employer] Kaye, RN 03/22/2016, 1:02 PM

## 2016-03-22 NOTE — Progress Notes (Signed)
  Patient's echocardiogram resulted showing an EF of 20-25% (no prior imaging). Patient's nurse called to verify whether or not to start on Xarelto in case he required a cardiac catheterization.  Discussed with Dr. Eden EmmsNishan. He recommended a repeat echocardiogram several months out once the patient's atrial fibrillation is rate-controlled and hyperthyroidism has improved to reassess his EF. May require further ischemic evaluation at that time if EF remains reduced or if he develops anginal symptoms in the interim. Therefore, will start on Xarelto.   Signed, Ellsworth LennoxBrittany M Rayleigh Gillyard, PA-C 03/22/2016, 5:55 PM Pager: [phone]

## 2016-03-22 NOTE — Progress Notes (Addendum)
Patient ID: Troy Morris, male   DOB: 1976/03/12, 40 y.o.   MRN: [id number]  PROGRESS NOTE    Troy Morris  WUJ:[id number] DOB: [birth year] DOA: 03/21/2016  PCP: Lonia Blood, MD  Brief Narrative:  40 year old male with past medical history of hypertension and hyperthyroidism (diagnosed in 2007). He has been on methimazole and atenolol however has only been partially compliant with his medications. His PCP has recommended radiation therapies in the past for many years but due to insurance reasons he has not underwent the procedure.   Patient has not taken methimazole for the past month. He reports increasing shortness of breath with exertion over last week or so prior to this admission with occasional PND episodes. Over last 3 days prior to this admission he began to have mild productive cough and worsening lower extremity edema for which reason he sought care in urgent care. He was found to have tachycardia and was in atrial fibrillation and therefore sent to ED for evaluation. Patient reports occasionally having left-sided chest pain related to exertion which would last for a few seconds at the time and then spontaneously resolved with rest. He does not have chest pain at this time.  Upon arrival in the ED, he was hypertensive with systolic blood pressure in the 150s. Heart rate was in the 130s to 150s. His TSH was severely low at less than 0.01, free T4 was elevated at 5.22. Hemoglobin 12.5, Troponin negative, BNP 279, Potassium 3.4, ALP was 223, albumin 3.2, AST and ALT normal. Cardiology was consulted for atrial fibrillation with RVR in the setting of medication noncompliance and hyperthyroidism. He was started on Cardizem drip.   Assessment & Plan:   Newly diagnosed afib with RVR in the setting of medication noncompliance and hyperthyroidism - Patient noncompliant with methimazole and atenolol which likely has triggered atrial fibrillation - CHA2DS2-Vasc score 1 (HTN) - Per cardiology,  given uncontrolled hyperthyroidism chance of converting to normal sinus rhythm is low. They will not attempt TEE cardioversion while patient is still hyperthyroid  - He is on Lovenox subcutaneous. Dose anticoagulation at this time but this may be changed to Xarelto or Eliquis per cardiology - He is currently on Cardizem drip and will switch to metoprolol 25 mg PO BID as we titrate down Cardizem   Acute diastolic heart failure in the setting of afib of RVR - BNP 279 - No previous 2-D echo on file. 2-D echo on this admission is pending - Continue Lasix 40 mg IV twice a day - Continue daily weight and strict intake and output - Appreciate cardiology following  Hyperthyroidism - Patient is noncompliant with medication -Currently on methimazole 10 mg daily, metoprolol 25 mg twice daily  Essential hypertension - Patient on Lasix, metoprolol and ramipril   Hypokalemia - Due to lasix - Now WNL  Elevated alkaline phosphatase - ALP elevated at 208 in addition to elevated total bilirubin 4.8 - AST and ALT are within normal limits - No acute findings on abdominal ultrasound - We'll continue to monitor    DVT prophylaxis: On Lovenox subcutaneous, full dose anticoagulation Code Status: full code  Family Communication: No family at bedside Disposition Plan: Home once cleared by cardiology   Consultants:   Cardiology  Procedures:   None  Antimicrobials:   None   Subjective: Feels better, no overnight events.  Objective: Filed Vitals:   03/22/16 0400 03/22/16 0500 03/22/16 0835 03/22/16 1005  BP: 142/103 157/99 156/99 117/67  Pulse:  118 114 80  Temp:  98.9 F (37.2 C) 98 F (36.7 C)   TempSrc:  Oral Oral   Resp: 22 27  33  Height:      Weight:  116.529 kg (256 lb 14.4 oz)    SpO2:  91% 95% 95%   No intake or output data in the 24 hours ending 03/22/16 1012 Filed Weights   03/21/16 2344 03/22/16 0500  Weight: 115.395 kg (254 lb 6.4 oz) 116.529 kg (256 lb 14.4 oz)      Examination:  General exam: Appears calm and comfortable  Respiratory system: Clear to auscultation. Respiratory effort normal. Cardiovascular system: S1 & S2 heard, Rate controlled, irregular Gastrointestinal system: Abdomen is nondistended, soft and nontender. No organomegaly or masses felt. Normal bowel sounds heard. Central nervous system: Alert and oriented. No focal neurological deficits. Extremities: Symmetric 5 x 5 power. (+1) pedal edema Skin: No rashes, lesions or ulcers Psychiatry: Judgement and insight appear normal. Mood & affect appropriate.   Data Reviewed: I have personally reviewed following labs and imaging studies  CBC:  Recent Labs Lab 03/21/16 2019 03/22/16 0359  WBC 8.3 8.2  NEUTROABS 5.1 4.7  HGB 12.5* 11.8*  HCT 36.8* 35.3*  MCV 72.3* 70.6*  PLT 187 194   Basic Metabolic Panel:  Recent Labs Lab 03/21/16 2019 03/22/16 0359  NA 138 135  K 3.4* 3.7  CL 106 103  CO2 21* 20*  GLUCOSE 107* 111*  BUN 9 11  CREATININE 0.72 0.76  CALCIUM 8.8* 8.5*   GFR: Estimated Creatinine Clearance: 164.8 mL/min (by C-G formula based on Cr of 0.76). Liver Function Tests:  Recent Labs Lab 03/21/16 2019 03/22/16 0359  AST 28 23  ALT 24 23  ALKPHOS 223* 208*  BILITOT 4.7* 4.8*  PROT 7.2 6.9  ALBUMIN 3.2* 3.1*   No results for input(s): LIPASE, AMYLASE in the last 168 hours. No results for input(s): AMMONIA in the last 168 hours. Coagulation Profile:  Recent Labs Lab 03/21/16 2148 03/22/16 0359  INR 1.46 1.53*   Cardiac Enzymes:  Recent Labs Lab 03/21/16 2148 03/21/16 2241 03/22/16 0358  CKTOTAL 63  --   --   TROPONINI  --  <0.03 <0.03   BNP (last 3 results) No results for input(s): PROBNP in the last 8760 hours. HbA1C: No results for input(s): HGBA1C in the last 72 hours. CBG: No results for input(s): GLUCAP in the last 168 hours. Lipid Profile: No results for input(s): CHOL, HDL, LDLCALC, TRIG, CHOLHDL, LDLDIRECT in the last 72  hours. Thyroid Function Tests:  Recent Labs  03/21/16 2019 03/21/16 2352  TSH <0.010*  --   FREET4  --  5.22*   Anemia Panel: No results for input(s): VITAMINB12, FOLATE, FERRITIN, TIBC, IRON, RETICCTPCT in the last 72 hours. Urine analysis: No results found for: COLORURINE, APPEARANCEUR, LABSPEC, PHURINE, GLUCOSEU, HGBUR, BILIRUBINUR, KETONESUR, PROTEINUR, UROBILINOGEN, NITRITE, LEUKOCYTESUR Sepsis Labs: @LABRCNTIP (procalcitonin:4,lacticidven:4)   )No results found for this or any previous visit (from the past 240 hour(s)).    Radiology Studies: Dg Chest Port 1 View 03/21/2016  Mild congestive heart failure with small bilateral pleural effusions. Electronically Signed   By: Myles RosenthalJohn  Stahl M.D.   On: 03/21/2016 21:43   [place] Abdomen Limited Ruq 03/21/2016 Minimally increased hepatic echotexture otherwise unremarkable right upper quadrant ultrasound. Partially visualized right pleural effusion. Electronically Signed   By: Elgie CollardArash  Radparvar M.D.   On: 03/21/2016 22:22     Scheduled Meds: . furosemide  40 mg Intravenous BID  . methimazole  10 mg Oral Daily  .  metoprolol tartrate  25 mg Oral BID  . ramipril  1.25 mg Oral Daily  . rivaroxaban  20 mg Oral Q supper  . sodium chloride flush  3 mL Intravenous Q12H   Continuous Infusions: . diltiazem (CARDIZEM) infusion 15 mg/hr (03/22/16 0825)     LOS: 1 day    Time spent: 25 minutes    Manson Passey, MD [facility] Pager [phone]  If 7PM-7AM, please contact night-coverage [email or web address] Password [email or web address] 03/22/2016, 10:12 AM

## 2016-03-23 DIAGNOSIS — I5022 Chronic systolic (congestive) heart failure: Secondary | ICD-10-CM

## 2016-03-23 DIAGNOSIS — I519 Heart disease, unspecified: Secondary | ICD-10-CM

## 2016-03-23 DIAGNOSIS — I10 Essential (primary) hypertension: Secondary | ICD-10-CM | POA: Insufficient documentation

## 2016-03-23 DIAGNOSIS — E8770 Fluid overload, unspecified: Secondary | ICD-10-CM

## 2016-03-23 DIAGNOSIS — I5031 Acute diastolic (congestive) heart failure: Secondary | ICD-10-CM | POA: Insufficient documentation

## 2016-03-23 HISTORY — DX: Heart disease, unspecified: I51.9

## 2016-03-23 LAB — BASIC METABOLIC PANEL
ANION GAP: 9 (ref 5–15)
BUN: 19 mg/dL (ref 6–20)
CALCIUM: 8.9 mg/dL (ref 8.9–10.3)
CO2: 23 mmol/L (ref 22–32)
Chloride: 105 mmol/L (ref 101–111)
Creatinine, Ser: 0.98 mg/dL (ref 0.61–1.24)
GFR calc Af Amer: >60 mL/min (ref >60)
GLUCOSE: 121 mg/dL — AB (ref 65–99)
POTASSIUM: 3.4 mmol/L — AB (ref 3.5–5.1)
SODIUM: 137 mmol/L (ref 135–145)

## 2016-03-23 LAB — CBC
HEMATOCRIT: 35 % — AB (ref 39.0–52.0)
HEMOGLOBIN: 11.8 g/dL — AB (ref 13.0–17.0)
MCH: 23.6 pg — AB (ref 26.0–34.0)
MCHC: 33.7 g/dL (ref 30.0–36.0)
MCV: 70 fL — ABNORMAL LOW (ref 78.0–100.0)
Platelets: 178 10*3/uL (ref 150–400)
RBC: 5 MIL/uL (ref 4.22–5.81)
RDW: 15.8 % — AB (ref 11.5–15.5)
WBC: 8.8 10*3/uL (ref 4.0–10.5)

## 2016-03-23 MED ORDER — METOPROLOL SUCCINATE ER 50 MG PO TB24: 50.0000 mg | ORAL_TABLET | Freq: Two times a day (BID) | ORAL | Status: DC

## 2016-03-23 MED ORDER — DIGOXIN 0.25 MG/ML IJ SOLN
0.2500 mg | Freq: Once | INTRAMUSCULAR | Status: AC
  Administered 2016-03-23: 0.25 mg via INTRAVENOUS
  Filled 2016-03-23: qty 2

## 2016-03-23 MED ORDER — METOPROLOL SUCCINATE ER 50 MG PO TB24: 75.0000 mg | ORAL_TABLET | Freq: Two times a day (BID) | ORAL | Status: DC

## 2016-03-23 MED ORDER — DIGOXIN 125 MCG PO TABS
0.1250 mg | ORAL_TABLET | Freq: Every day | ORAL | Status: DC
  Administered 2016-03-24 – 2016-03-26 (×3): 0.125 mg via ORAL
  Filled 2016-03-23 (×3): qty 1

## 2016-03-23 MED ORDER — METOPROLOL SUCCINATE ER 25 MG PO TB24
25.0000 mg | ORAL_TABLET | Freq: Once | ORAL | Status: AC
  Administered 2016-03-23: 25 mg via ORAL
  Filled 2016-03-23: qty 1

## 2016-03-23 MED ORDER — METOPROLOL SUCCINATE ER 50 MG PO TB24
75.0000 mg | ORAL_TABLET | Freq: Two times a day (BID) | ORAL | Status: DC
  Administered 2016-03-23 – 2016-03-24 (×2): 75 mg via ORAL
  Filled 2016-03-23 (×2): qty 1

## 2016-03-23 MED ORDER — POTASSIUM CHLORIDE CRYS ER 20 MEQ PO TBCR
40.0000 meq | EXTENDED_RELEASE_TABLET | Freq: Every day | ORAL | Status: DC
  Administered 2016-03-23 – 2016-03-24 (×2): 40 meq via ORAL
  Filled 2016-03-23 (×2): qty 2

## 2016-03-23 MED ORDER — RIVAROXABAN (XARELTO) EDUCATION KIT FOR DVT/PE PATIENTS: PACK | Freq: Once | Status: DC

## 2016-03-23 NOTE — Progress Notes (Signed)
HR 130-140s despite increasing BB, will increase toprol XL to 75mg  BID tonight and give 1 dose IV digoxin, discussed with pharmacist, hyperthyroidism does decrease digoxin concentration by increasing metabolism, therefore higher dose maybe needed to control HR. Will give 0.25mg  IV digoxin today, start 0.125mg  daily tomorrow.   Ramond DialSigned, Allen Egerton PA Pager: [phone]

## 2016-03-23 NOTE — Care Management Note (Signed)
Case Management Note  Patient Details  Name: Troy Morris MRN: [id number] Date of Birth: 11-26-1975  Subjective/Objective:         Afib with RVR          Action/Plan: Discharge Planning:  NCM spoke to pt and states he does not have insurance coverage. He recently started a new job and has been working for only two weeks. He has to work 90 days to qualify. Will give pt Xarelto 30 day free trial card and patient assistance application to have completed at his follow up appt. [facility]/2016.    Expected Discharge Date:              Expected Discharge Plan:  Home/Self Care  In-House Referral:  Financial Counselor  Discharge planning Services  CM Consult, [facility], Medication Assistance  Status of Service:  In process, will continue to follow  Medicare Important Message Given:    Date Medicare IM Given:    Medicare IM give by:    Date Additional Medicare IM Given:    Additional Medicare Important Message give by:     If discussed at Long Length of Stay Meetings, dates discussed:    Additional Comments:  Troy Morris, Troy Armato Ellen, RN 03/23/2016, 6:47 PM

## 2016-03-23 NOTE — Progress Notes (Signed)
Patient Name: Troy Morris Date of Encounter: 03/23/2016  Primary Cardiologist: Dr. Eden EmmsNishan   Principal Problem:   Atrial fibrillation with RVR (HCC) Active Problems:   Elevated bilirubin   Hyperthyroidism   CHF (congestive heart failure) (HCC)   Fluid overload   LV dysfunction    SUBJECTIVE  Denies any CP or SOB.   CURRENT MEDS . furosemide  40 mg Intravenous BID  . methimazole  10 mg Oral Daily  . metoprolol succinate  25 mg Oral Once  . metoprolol succinate  50 mg Oral BID  . ramipril  1.25 mg Oral Daily  . rivaroxaban  20 mg Oral Q supper  . sodium chloride flush  3 mL Intravenous Q12H    OBJECTIVE  Filed Vitals:   03/22/16 2100 03/23/16 0007 03/23/16 0400 03/23/16 1050  BP: 124/85 139/88 129/85 123/81  Pulse: 117 66 61 86  Temp: 98.4 F (36.9 C) 98.7 F (37.1 C) 97.6 F (36.4 C)   TempSrc: Oral Oral Oral   Resp: 20 20 25 28   Height:      Weight:   258 lb 12.8 oz (117.391 kg)   SpO2: 95% 94% 97% 94%    Intake/Output Summary (Last 24 hours) at 03/23/16 1138 Last data filed at 03/23/16 1040  Gross per 24 hour  Intake    960 ml  Output    970 ml  Net    -10 ml   Filed Weights   03/21/16 2344 03/22/16 0500 03/23/16 0400  Weight: 254 lb 6.4 oz (115.395 kg) 256 lb 14.4 oz (116.529 kg) 258 lb 12.8 oz (117.391 kg)    PHYSICAL EXAM  General: Pleasant, NAD. Neuro: Alert and oriented X 3. Moves all extremities spontaneously. Psych: Normal affect. HEENT:  Normal  Neck: Supple without bruits. +JVD Lungs:  Resp regular and unlabored. Decreased breath sound in bilateral region Heart: tachycardic, irregular. no s3, s4, or murmurs. Abdomen: Soft, non-tender, non-distended, BS + x 4.  Extremities: No clubbing, cyanosis. DP/PT/Radials 2+ and equal bilaterally. 2-3+ pitting edema  Accessory Clinical Findings  CBC  Recent Labs  03/21/16 2019 03/22/16 0359 03/23/16 0434  WBC 8.3 8.2 8.8  NEUTROABS 5.1 4.7  --   HGB 12.5* 11.8* 11.8*  HCT 36.8*  35.3* 35.0*  MCV 72.3* 70.6* 70.0*  PLT 187 194 178   Basic Metabolic Panel  Recent Labs  03/21/16 2019 03/22/16 0359  NA 138 135  K 3.4* 3.7  CL 106 103  CO2 21* 20*  GLUCOSE 107* 111*  BUN 9 11  CREATININE 0.72 0.76  CALCIUM 8.8* 8.5*   Liver Function Tests  Recent Labs  03/21/16 2019 03/22/16 0359  AST 28 23  ALT 24 23  ALKPHOS 223* 208*  BILITOT 4.7* 4.8*  PROT 7.2 6.9  ALBUMIN 3.2* 3.1*   Cardiac Enzymes  Recent Labs  03/21/16 2148 03/21/16 2241 03/22/16 0358 03/22/16 0931  CKTOTAL 63  --   --   --   TROPONINI  --  <0.03 <0.03 <0.03   Thyroid Function Tests  Recent Labs  03/21/16 2019  TSH <0.010*    TELE afib with HR 130-140s    ECG  No new EKG  Echocardiogram 03/22/2016  LV EF: 20% - 25%  ------------------------------------------------------------------- Indications: CHF - 428.0.  ------------------------------------------------------------------- History: Risk factors: Hypertension.  ------------------------------------------------------------------- Study Conclusions  - Left ventricle: The cavity size was mildly dilated. There was  mild concentric hypertrophy. Systolic function was severely  reduced. The estimated ejection fraction was in  the range of 20%  to 25%. Diffuse hypokinesis. - Ventricular septum: The contour showed diastolic flattening and  systolic flattening. - Aortic valve: Trileaflet; normal thickness leaflets. There was no  regurgitation. - Mitral valve: Structurally normal valve. There was mild  regurgitation. - Left atrium: The atrium was moderately dilated. - Right ventricle: The cavity size was moderately dilated. Wall  thickness was normal. Systolic function was moderately reduced. - Right atrium: The atrium was moderately dilated. - Tricuspid valve: There was moderate regurgitation. - Pulmonic valve: There was no regurgitation. - Pulmonary arteries: Systolic pressure was within  the normal  range. - Inferior vena cava: The vessel was normal in size. - Pericardium, extracardiac: There is mild to moderate pericardial  effusion predominantly around the inferolateral walls.  Impressions:  - There is mild to moderate pericardial effusion predominantly  around the inferolateral walls. No signs of hemodynamic  compromise.    Radiology/Studies  Dg Chest Port 1 View  03/21/2016  CLINICAL DATA:  Shortness breath and tachycardia for 1 day. Atrial fibrillation with rapid ventricular response. EXAM: PORTABLE CHEST 1 VIEW COMPARISON:  None. FINDINGS: Moderate to severe cardiomegaly noted. Mild diffuse interstitial infiltrates seen, consistent with interstitial edema. No focal consolidation identified. Small bilateral pleural effusions also noted. IMPRESSION: Mild congestive heart failure with small bilateral pleural effusions. Electronically Signed   By: Myles Rosenthal M.D.   On: 03/21/2016 21:43   US Thyroid  03/22/2016  CLINICAL DATA:  Hyperthyroidism. EXAM: THYROID ULTRASOUND TECHNIQUE: Ultrasound examination of the thyroid gland and adjacent soft tissues was performed. COMPARISON:  None. FINDINGS: Right thyroid lobe Measurements: 8.5 x 3.6 x 4.2 cm. Right thyroid tissue is diffusely heterogeneous but there is not a discrete nodule or mass. Left thyroid lobe Measurements: 9.0 x 3.9 x 3.9 cm. Left thyroid tissue is diffusely heterogeneous without a discrete nodule. Isthmus Thickness: 1.3 cm.  No nodules visualized. Lymphadenopathy None visualized. IMPRESSION: Thyromegaly. The thyroid tissue is heterogeneous but no discrete nodules. Electronically Signed   By: Richarda Overlie M.D.   On: 03/22/2016 13:21   US Abdomen Limited Ruq  03/21/2016  CLINICAL DATA:  40 year old male with elevated bilirubin EXAM: US ABDOMEN LIMITED - RIGHT UPPER QUADRANT COMPARISON:  None. FINDINGS: Gallbladder: No gallstones or wall thickening visualized. No sonographic Murphy sign noted by sonographer. Common  bile duct: Diameter: 3 mm Liver: Minimal increased hepatic echotexture. A right-sided pleural effusions partially visualized. IMPRESSION: Minimally increased hepatic echotexture otherwise unremarkable right upper quadrant ultrasound. Partially visualized right pleural effusion. Electronically Signed   By: Elgie Collard M.D.   On: 03/21/2016 22:22    ASSESSMENT AND PLAN  1. Newly diagnosed afib with RVR in the setting of medication noncompliance and hyperthyroidism - Has not taken methimazole for over a month. - CHA2DS2-Vasc score 1 (HTN), given uncontrolled hyperthyroidism, chance of converting by himself is low, will not attempt TEE cardioversion while hyperthyroidism is not controlled. Started on Xarelto yesterday - Not sure how much we can control the heart rate with hyperthyroidism, target HR likely around 110.  - Echo shows low EF 20-25%, mild LVH, mild MR, RVEF moderately reduced, moderate TR, mild to moderate pericardial effusion  - plan is to repeat echo in several month once HR is controlled, if EF still low, plan for cath.  - HR 130-140s this morning, he has received  metoprolol tartrate this morning, will give him  Toprol XL this morning and change to  BID toprol XL starting tonight.  2. Acute on chronic diastolic heart  failure in the setting of afib of RVR - Echo 03/22/2016 EF 20-25%, diffuse hypokinesis, mild MR, RVEF moderately reduced, moderate TR, mild to moderate pericardial effusion  - does not appears to have great urinary output yesterday, will check BMET, potentially adjust lasix dose.  3. Hyperthyroidism: noncompliant with medication  4. HTN  Signed, Azalee Course PA-C Pager: [phone] Agree with note by Azalee Course PA-C  Pt admitted with systolic CHF along with AFIB with RVR. He is signif hyperthyroid which prob initiated the cascade. He has begun a NOAC (Xarelto) and is getting rate controlled as well as  diuresing. The KEY ISSUE will be treatment of the hyperthyroidism. Will continue to follow with you and adjust neg chronotropes as necesary  Runell Gess, M.D., FACP, [facility], [facility] [address] [place], [place]  [address]  [phone] 03/23/2016 2:16 PM

## 2016-03-23 NOTE — Plan of Care (Signed)
Problem: Education: Goal: Ability to demonstrate managment of disease process will improve Outcome: Progressing Patient gained 2 pounds overnight. Patient has only put out 320 cc of dark amber urine for the shift. Patient was educated about the importance of measuring his urine output, and he verbalized understanding. Patient was educated about the purpose of daily weights on a standing scale and verbalized understanding. Patient was educated that he needs to keep his fluid intake less than 2 liters over a 24 hour period and that this amounts to about 8 of our styrofoam cups. Patient states that he has been drinking a lot of fluid. Patient was educated that his fluid intake needs to be strictly documented, and he verbalized understanding.

## 2016-03-23 NOTE — Discharge Instructions (Signed)

## 2016-03-23 NOTE — Progress Notes (Addendum)
Patient ID: Troy Morris, male   DOB: [birth year], 40 y.o.   MRN: [id number]  PROGRESS NOTE    Troy Morris  OZD:[id number] DOB: [birth year] DOA: 03/21/2016  PCP: Lonia BloodGARBA,LAWAL, MD   Brief Narrative:  40 year old male with past medical history of hypertension and hyperthyroidism (diagnosed in 2007). He has been on methimazole and atenolol however has only been partially compliant with his medications. His PCP has recommended radiation therapies in the past for many years but due to insurance reasons he has not underwent the procedure.   Patient has not taken methimazole for the past month. He reports increasing shortness of breath with exertion over last week or so prior to this admission with occasional PND episodes. Over last 3 days prior to this admission he began to have mild productive cough and worsening lower extremity edema for which reason he sought care in urgent care. He was found to have tachycardia and was in atrial fibrillation and therefore sent to ED for evaluation. Patient reports occasionally having left-sided chest pain related to exertion which would last for a few seconds at the time and then spontaneously resolved with rest. He does not have chest pain at this time.  Upon arrival in the ED, he was hypertensive with systolic blood pressure in the 150s. Heart rate was in the 130s to 150s. His TSH was severely low at less than 0.01, free T4 was elevated at 5.22. Hemoglobin 12.5, Troponin negative, BNP 279, Potassium 3.4, ALP was 223, albumin 3.2, AST and ALT normal. Cardiology was consulted for atrial fibrillation with RVR in the setting of medication noncompliance and hyperthyroidism. He was started on Cardizem drip.   Assessment & Plan:   Newly diagnosed afib with RVR in the setting of medication noncompliance and hyperthyroidism - CHA2DS2-Vasc score 1 (HTN) prior to admission but now 2 as pt with findings of CHF on 2 D ECHO (EF 20%) - Now on xarelto, heparin drip stopped -  Monitor for bleeding  - HR control with digoxin, metoprolol - Cardizem drip stopped - Cardiology following, we appreciate their input   Acute diastolic heart failure in the setting of afib of RVR - BNP 279 - 2 D ECHO on this admission showed EF 20% - Cardio added digoxin 0.125 mg daily and ramipril 1.25 mg daily  - Continue Lasix 40 mg IV twice a day - Started metoprolol 75 mg PO BID on 03/23/2016 - Continue daily weight and strict intake and output  Hyperthyroidism - Patient is noncompliant with medication - Currently on methimazole 10 mg daily, metoprolol 75 mg bid  Essential hypertension - Patient on Lasix, metoprolol and ramipril   Hypokalemia - Due to lasix - Supplemented - Now WNL  Elevated alkaline phosphatase - ALP elevated at 208 in addition to elevated total bilirubin 4.8 - AST and ALT are within normal limits - No acute findings on abdominal ultrasound - Repeat CMP  In am    DVT prophylaxis: On Lovenox subcutaneous, full dose anticoagulation Code Status: full code  Family Communication: No family at bedside Disposition Plan: Home once cleared by cardiology   Consultants:   Cardiology  Procedures:   None  Antimicrobials:   None   Subjective: Feels better, no overnight events.  Objective: Filed Vitals:   03/23/16 0007 03/23/16 0400 03/23/16 0752 03/23/16 1050  BP: 139/88 129/85  123/81  Pulse: 66 61  86  Temp: 98.7 F (37.1 C) 97.6 F (36.4 C)    TempSrc: Oral Oral    Resp:  20 25 24 28   Height:      Weight:  117.391 kg (258 lb 12.8 oz)    SpO2: 94% 97%  94%    Intake/Output Summary (Last 24 hours) at 03/23/16 1648 Last data filed at 03/23/16 1600  Gross per 24 hour  Intake    960 ml  Output   1920 ml  Net   -960 ml   Filed Weights   03/21/16 2344 03/22/16 0500 03/23/16 0400  Weight: 115.395 kg (254 lb 6.4 oz) 116.529 kg (256 lb 14.4 oz) 117.391 kg (258 lb 12.8 oz)    Examination:  General exam: Appears calm and comfortable    Respiratory system: Clear to auscultation. Respiratory effort normal. Cardiovascular system: S1 & S2 heard, Rate controlled, irregular Gastrointestinal system: Abdomen is nondistended, soft and nontender. No organomegaly or masses felt. Normal bowel sounds heard. Central nervous system: Alert and oriented. No focal neurological deficits. Extremities: Symmetric 5 x 5 power. (+1) pedal edema Skin: No rashes, lesions or ulcers Psychiatry: Judgement and insight appear normal. Mood & affect appropriate.   Data Reviewed: I have personally reviewed following labs and imaging studies  CBC:  Recent Labs Lab 03/21/16 2019 03/22/16 0359 03/23/16 0434  WBC 8.3 8.2 8.8  NEUTROABS 5.1 4.7  --   HGB 12.5* 11.8* 11.8*  HCT 36.8* 35.3* 35.0*  MCV 72.3* 70.6* 70.0*  PLT 187 194 178   Basic Metabolic Panel:  Recent Labs Lab 03/21/16 2019 03/22/16 0359 03/23/16 1241  NA 138 135 137  K 3.4* 3.7 3.4*  CL 106 103 105  CO2 21* 20* 23  GLUCOSE 107* 111* 121*  BUN 9 11 19   CREATININE 0.72 0.76 0.98  CALCIUM 8.8* 8.5* 8.9   GFR: Estimated Creatinine Clearance: 135.1 mL/min (by C-G formula based on Cr of 0.98). Liver Function Tests:  Recent Labs Lab 03/21/16 2019 03/22/16 0359  AST 28 23  ALT 24 23  ALKPHOS 223* 208*  BILITOT 4.7* 4.8*  PROT 7.2 6.9  ALBUMIN 3.2* 3.1*   No results for input(s): LIPASE, AMYLASE in the last 168 hours. No results for input(s): AMMONIA in the last 168 hours. Coagulation Profile:  Recent Labs Lab 03/21/16 2148 03/22/16 0359  INR 1.46 1.53*   Cardiac Enzymes:  Recent Labs Lab 03/21/16 2148 03/21/16 2241 03/22/16 0358 03/22/16 0931  CKTOTAL 63  --   --   --   TROPONINI  --  <0.03 <0.03 <0.03   BNP (last 3 results) No results for input(s): PROBNP in the last 8760 hours. HbA1C: No results for input(s): HGBA1C in the last 72 hours. CBG: No results for input(s): GLUCAP in the last 168 hours. Lipid Profile: No results for input(s): CHOL,  HDL, LDLCALC, TRIG, CHOLHDL, LDLDIRECT in the last 72 hours. Thyroid Function Tests:  Recent Labs  03/21/16 2019 03/21/16 2352  TSH <0.010*  --   FREET4  --  5.22*   Anemia Panel: No results for input(s): VITAMINB12, FOLATE, FERRITIN, TIBC, IRON, RETICCTPCT in the last 72 hours. Urine analysis:    Component Value Date/Time   COLORURINE AMBER* 03/22/2016 1911   APPEARANCEUR TURBID* 03/22/2016 1911   LABSPEC 1.015 03/22/2016 1911   PHURINE 5.0 03/22/2016 1911   GLUCOSEU NEGATIVE 03/22/2016 1911   HGBUR SMALL* 03/22/2016 1911   BILIRUBINUR SMALL* 03/22/2016 1911   KETONESUR NEGATIVE 03/22/2016 1911   PROTEINUR >300* 03/22/2016 1911   NITRITE NEGATIVE 03/22/2016 1911   LEUKOCYTESUR NEGATIVE 03/22/2016 1911   Sepsis Labs: @LABRCNTIP (procalcitonin:4,lacticidven:4)   ) Recent  Results (from the past 240 hour(s))  MRSA PCR Screening     Status: None   Collection Time: 03/22/16 12:30 AM  Result Value Ref Range Status   MRSA by PCR NEGATIVE NEGATIVE Final    Comment:        The GeneXpert MRSA Assay (FDA approved for NASAL specimens only), is one component of a comprehensive MRSA colonization surveillance program. It is not intended to diagnose MRSA infection nor to guide or monitor treatment for MRSA infections.       Radiology Studies: Dg Chest Port 1 View 03/21/2016  Mild congestive heart failure with small bilateral pleural effusions. Electronically Signed   By: Myles Rosenthal M.D.   On: 03/21/2016 21:43   US Abdomen Limited Ruq 03/21/2016 Minimally increased hepatic echotexture otherwise unremarkable right upper quadrant ultrasound. Partially visualized right pleural effusion. Electronically Signed   By: Elgie Collard M.D.   On: 03/21/2016 22:22     Scheduled Meds: . furosemide  40 mg Intravenous BID  . methimazole  10 mg Oral Daily  . metoprolol succinate  50 mg Oral BID  . potassium chloride  40 mEq Oral Daily  . ramipril  1.25 mg Oral Daily  . rivaroxaban  20  mg Oral Q supper  . sodium chloride flush  3 mL Intravenous Q12H   Continuous Infusions: . diltiazem (CARDIZEM) infusion Stopped (03/22/16 1040)     LOS: 2 days    Time spent: 25 minutes    Manson Passey, MD [facility] Pager [phone]  If 7PM-7AM, please contact night-coverage [email or web address] Password [facility] 03/23/2016, 4:48 PM

## 2016-03-24 DIAGNOSIS — I5040 Unspecified combined systolic (congestive) and diastolic (congestive) heart failure: Secondary | ICD-10-CM

## 2016-03-24 DIAGNOSIS — I481 Persistent atrial fibrillation: Secondary | ICD-10-CM

## 2016-03-24 LAB — COMPREHENSIVE METABOLIC PANEL
ALBUMIN: 2.8 g/dL — AB (ref 3.5–5.0)
ALK PHOS: 174 U/L — AB (ref 38–126)
ALT: 28 U/L (ref 17–63)
AST: 29 U/L (ref 15–41)
Anion gap: 10 (ref 5–15)
BILIRUBIN TOTAL: 3.1 mg/dL — AB (ref 0.3–1.2)
BUN: 15 mg/dL (ref 6–20)
CO2: 27 mmol/L (ref 22–32)
Calcium: 8.3 mg/dL — ABNORMAL LOW (ref 8.9–10.3)
Chloride: 104 mmol/L (ref 101–111)
Creatinine, Ser: 0.87 mg/dL (ref 0.61–1.24)
GFR calc Af Amer: >60 mL/min (ref >60)
GFR calc non Af Amer: >60 mL/min (ref >60)
GLUCOSE: 85 mg/dL (ref 65–99)
POTASSIUM: 3.6 mmol/L (ref 3.5–5.1)
Sodium: 141 mmol/L (ref 135–145)
TOTAL PROTEIN: 6.5 g/dL (ref 6.5–8.1)

## 2016-03-24 LAB — CBC
HEMATOCRIT: 34.6 % — AB (ref 39.0–52.0)
HEMOGLOBIN: 11.5 g/dL — AB (ref 13.0–17.0)
MCH: 23.3 pg — ABNORMAL LOW (ref 26.0–34.0)
MCHC: 33.2 g/dL (ref 30.0–36.0)
MCV: 70.2 fL — ABNORMAL LOW (ref 78.0–100.0)
Platelets: 165 10*3/uL (ref 150–400)
RBC: 4.93 MIL/uL (ref 4.22–5.81)
RDW: 15.8 % — AB (ref 11.5–15.5)
WBC: 7 10*3/uL (ref 4.0–10.5)

## 2016-03-24 MED ORDER — POTASSIUM CHLORIDE CRYS ER 20 MEQ PO TBCR
40.0000 meq | EXTENDED_RELEASE_TABLET | Freq: Two times a day (BID) | ORAL | Status: DC
  Administered 2016-03-24 – 2016-03-26 (×4): 40 meq via ORAL
  Filled 2016-03-24 (×4): qty 2

## 2016-03-24 MED ORDER — METOPROLOL TARTRATE 25 MG PO TABS
25.0000 mg | ORAL_TABLET | Freq: Once | ORAL | Status: AC
  Administered 2016-03-24: 25 mg via ORAL
  Filled 2016-03-24: qty 1

## 2016-03-24 MED ORDER — METOPROLOL TARTRATE 100 MG PO TABS
100.0000 mg | ORAL_TABLET | Freq: Two times a day (BID) | ORAL | Status: DC
  Administered 2016-03-24 – 2016-03-26 (×4): 100 mg via ORAL
  Filled 2016-03-24 (×4): qty 1

## 2016-03-24 MED ORDER — FUROSEMIDE 10 MG/ML IJ SOLN
80.0000 mg | Freq: Two times a day (BID) | INTRAMUSCULAR | Status: DC
  Administered 2016-03-24 – 2016-03-26 (×4): 80 mg via INTRAVENOUS
  Filled 2016-03-24 (×4): qty 8

## 2016-03-24 NOTE — Plan of Care (Signed)
Problem: Food- and Nutrition-Related Knowledge Deficit (NB-1.1) Goal: Nutrition education Formal process to instruct or train a patient/client in a skill or to impart knowledge to help patients/clients voluntarily manage or modify food choices and eating behavior to maintain or improve health. Nutrition Education Note  RD consulted for nutrition education regarding a Heart Healthy diet.   Lipid Panel  No results found for: CHOL, TRIG, HDL, CHOLHDL, VLDL, LDLCALC  RD provided "Heart Healthy Nutrition Therapy" handout from the Academy of [employer]etics. Reviewed patient's dietary recall. Provided examples on ways to decrease sodium and fat intake in diet. Discouraged intake of processed foods and use of salt shaker. Encouraged fresh fruits and vegetables as well as whole grain sources of carbohydrates to maximize fiber intake. Teach back method used.  Expect good compliance.  Body mass index is 32.24 kg/(m^2). Pt meets criteria for class 1 obesity based on current BMI.  Current diet order is heart healthy, patient is consuming approximately 100% of meals at this time. Labs and medications reviewed. No further nutrition interventions warranted at this time. RD contact information provided. If additional nutrition issues arise, please re-consult RD.  Joaquin CourtsKimberly Harris, RD, LDN, CNSC Pager [phone] After Hours Pager [phone]

## 2016-03-24 NOTE — Progress Notes (Signed)
Subjective: No CP  A little dizziness   Objective: Filed Vitals:   03/23/16 1627 03/23/16 2057 03/24/16 0604 03/24/16 0727  BP: 126/104 146/73  138/71  Pulse:    98  Temp:  98.7 F (37.1 C)  97.9 F (36.6 C)  TempSrc:  Oral  Oral  Resp: Height:      Weight:   251 lb 3.2 oz (113.944 kg)   SpO2:  95%  97%   Weight change: -7 lb 9.6 oz (-3.447 kg)  Intake/Output Summary (Last 24 hours) at 03/24/16 1050 Last data filed at 03/24/16 1046  Gross per 24 hour  Intake    600 ml  Output   4975 ml  Net  -4375 ml    General: Alert, awake, oriented x3, in no acute distress Neck:  JVP is increased   Heart: Irreg Irreg  S1, S2 S3  No signif murmurs   Lungs: Rales at bases   Exemities:  1+  edema.   Neuro: Grossly intact, nonfocal.  Tele:  Afib  100s to 140s   Lab Results: Results for orders placed or performed during the hospital encounter of 03/21/16 (from the past 24 hour(s))  Basic metabolic panel     Status: Abnormal   Collection Time: 03/23/16 12:41 PM  Result Value Ref Range   Sodium 137 135 - 145 mmol/L   Potassium 3.4 (L) 3.5 - 5.1 mmol/L   Chloride 105 101 - 111 mmol/L   CO2 23 22 - 32 mmol/L   Glucose, Bld 121 (H) 65 - 99 mg/dL   BUN 19 6 - 20 mg/dL   Creatinine, Ser 1.61 0.61 - 1.24 mg/dL   Calcium 8.9 8.9 - 09.6 mg/dL   GFR calc non Af Amer >60 >60 mL/min   GFR calc Af Amer >60 >60 mL/min   Anion gap 9 5 - 15  CBC     Status: Abnormal   Collection Time: 03/24/16  4:52 AM  Result Value Ref Range   WBC 7.0 4.0 - 10.5 K/uL   RBC 4.93 4.22 - 5.81 MIL/uL   Hemoglobin 11.5 (L) 13.0 - 17.0 g/dL   HCT 04.5 (L) 40.9 - 81.1 %   MCV 70.2 (L) 78.0 - 100.0 fL   MCH 23.3 (L) 26.0 - 34.0 pg   MCHC 33.2 30.0 - 36.0 g/dL   RDW 91.4 (H) 78.2 - 95.6 %   Platelets 165 150 - 400 K/uL  Comprehensive metabolic panel     Status: Abnormal   Collection Time: 03/24/16  4:52 AM  Result Value Ref Range   Sodium 141 135 - 145 mmol/L   Potassium 3.6 3.5 - 5.1 mmol/L   Chloride 104 101 - 111 mmol/L   CO2 27 22 - 32 mmol/L   Glucose, Bld 85 65 - 99 mg/dL   BUN 15 6 - 20 mg/dL   Creatinine, Ser 2.13 0.61 - 1.24 mg/dL   Calcium 8.3 (L) 8.9 - 10.3 mg/dL   Total Protein 6.5 6.5 - 8.1 g/dL   Albumin 2.8 (L) 3.5 - 5.0 g/dL   AST 29 15 - 41 U/L   ALT 28 17 - 63 U/L   Alkaline Phosphatase 174 (H) 38 - 126 U/L   Total Bilirubin 3.1 (H) 0.3 - 1.2 mg/dL   GFR calc non Af Amer >60 >60 mL/min   GFR calc Af Amer >60 >60 mL/min   Anion gap 10 5 - 15    Studies/Results: No results  found.  Medications: REviewed    @PROBHOSP @  1  Atrial fib  Rates remain uncontrolled  WIll continue to titrate meds  Follow BP   Prob pushed by thyroid  Will need Xarelto  2  Acute on chronic systolic and diastolic CHF  LVEF 20 to 25% on echo  May be tachy induced and related to hyperthyroidism   Continue diuresis  I have increased lasix to 80 bid    3  Hyperthyroidism  Pt admits to not takine Tapazole  Stressed its importance    Will increase metoprolol    LOS: 3 days   Troy Patesaula Troy Morris 03/24/2016, 10:50 AM

## 2016-03-24 NOTE — Progress Notes (Addendum)
Patient ID: Troy Morris, male   DOB: 11-01-1976, 40 y.o.   MRN: [id number]  PROGRESS NOTE    Troy Morris  [id number] DOB: 11-01-1976 DOA: 03/21/2016  PCP: Lonia BloodGARBA,LAWAL, MD   Brief Narrative:  40 year old male with past medical history of hypertension and hyperthyroidism (diagnosed in 2007). He has been on methimazole and atenolol however has only been partially compliant with his medications. His PCP has recommended radiation therapies in the past for many years but due to insurance reasons he has not underwent the procedure.   Patient has not taken methimazole for the past month. He reports increasing shortness of breath with exertion over last week or so prior to this admission with occasional PND episodes. Over last 3 days prior to this admission he began to have mild productive cough and worsening lower extremity edema for which reason he sought care in urgent care. He was found to have tachycardia and was in atrial fibrillation and therefore sent to ED for evaluation. Patient reports occasionally having left-sided chest pain related to exertion which would last for a few seconds at the time and then spontaneously resolved with rest. He does not have chest pain at this time.  Upon arrival in the ED, he was hypertensive with systolic blood pressure in the 150s. Heart rate was in the 130s to 150s. His TSH was severely low at less than 0.01, free T4 was elevated at 5.22. Hemoglobin 12.5, Troponin negative, BNP 279, Potassium 3.4, ALP was 223, albumin 3.2, AST and ALT normal. Cardiology was consulted for atrial fibrillation with RVR in the setting of medication noncompliance and hyperthyroidism. He was started on Cardizem drip and heparin drip.   Assessment & Plan:   Newly diagnosed afib with RVR in the setting of medication noncompliance and hyperthyroidism - CHA2DS2-Vasc score 1 (HTN) prior to admission but now 2 as pt with findings of CHF on 2 D ECHO (EF 20%) - On xarelto (stopped  heparin drip) - HR controlled with digoxin, metoprolol  Acute diastolic heart failure in the setting of afib of RVR - BNP 279 - 2 D ECHO on this admission showed EF 20% - Continue digoxin 0.125 mg daily and ramipril 1.25 mg daily  - Continue Lasix 40 mg IV twice a day - Continue metoprolol 100 mg PO BID (increased from 75 mg to 100 mg this am due to tachycardia) - Weight in past 72 hours: 116.5 kg --> 117.3 kg --> 113.9 kg - Continue daily weight and strict intake and output  Hyperthyroidism - Patient noncompliant with medication - Continue methimazole 10 mg daily, metoprolol 75 mg bid - Thyroid ultrasound without discrete nodules   Essential hypertension - Continue lasix, metoprolol and ramipril   Hypokalemia - Due to lasix - Supplemented and WNL  Elevated alkaline phosphatase - ALP elevated at 208 in addition to elevated total bilirubin 4.8; repeat ALP today 174, bilirubin 3.1 - AST and ALT are within normal limits - No acute findings on abdominal ultrasound    DVT prophylaxis: On xarelto Code Status: full code  Family Communication: No family at bedside Disposition Plan: Home once cleared by cardiology   Consultants:   Cardiology  Procedures:   ECHO 03/22/2016 - EF 20-25%  Antimicrobials:   None   Subjective: No respiratory distress, no palpitations.   Objective: Filed Vitals:   03/23/16 1627 03/23/16 2057 03/24/16 0604 03/24/16 0727  BP: 126/104 146/73  138/71  Pulse:    98  Temp:  98.7 F (37.1 C)  97.9 F (36.6 C)  TempSrc:  Oral  Oral  Resp: Height:      Weight:   113.944 kg (251 lb 3.2 oz)   SpO2:  95%  97%    Intake/Output Summary (Last 24 hours) at 03/24/16 1346 Last data filed at 03/24/16 1230  Gross per 24 hour  Intake    600 ml  Output   4375 ml  Net  -3775 ml   Filed Weights   03/22/16 0500 03/23/16 0400 03/24/16 0604  Weight: 116.529 kg (256 lb 14.4 oz) 117.391 kg (258 lb 12.8 oz) 113.944 kg (251 lb 3.2 oz)     Examination:  General exam: Appears calm and comfortable, no acute distress  Respiratory system: Respiratory effort normal. No wheezing  Cardiovascular system: S1 & S2 (+), tachycardic  Gastrointestinal system: (+) BS, non tender, non distended abdomen  Central nervous system: No focal neurological deficits. Extremities: Strength 5/5 . (+1) pedal edema appreciated  Skin: Warm, dry Psychiatry: Normal mood and behavior   Data Reviewed: I have personally reviewed following labs and imaging studies  CBC:  Recent Labs Lab 03/21/16 2019 03/22/16 0359 03/23/16 0434 03/24/16 0452  WBC 8.3 8.2 8.8 7.0  NEUTROABS 5.1 4.7  --   --   HGB 12.5* 11.8* 11.8* 11.5*  HCT 36.8* 35.3* 35.0* 34.6*  MCV 72.3* 70.6* 70.0* 70.2*  PLT 187 194 178 165   Basic Metabolic Panel:  Recent Labs Lab 03/21/16 2019 03/22/16 0359 03/23/16 1241 03/24/16 0452  NA 138 135 137 141  K 3.4* 3.7 3.4* 3.6  CL 106 103 105 104  CO2 21* 20* 23 27  GLUCOSE 107* 111* 121* 85  BUN CREATININE 0.72 0.76 0.98 0.87  CALCIUM 8.8* 8.5* 8.9 8.3*   GFR: Estimated Creatinine Clearance: 150 mL/min (by C-G formula based on Cr of 0.87). Liver Function Tests:  Recent Labs Lab 03/21/16 2019 03/22/16 0359 03/24/16 0452  AST ALT ALKPHOS 223* 208* 174*  BILITOT 4.7* 4.8* 3.1*  PROT 7.2 6.9 6.5  ALBUMIN 3.2* 3.1* 2.8*   No results for input(s): LIPASE, AMYLASE in the last 168 hours. No results for input(s): AMMONIA in the last 168 hours. Coagulation Profile:  Recent Labs Lab 03/21/16 2148 03/22/16 0359  INR 1.46 1.53*   Cardiac Enzymes:  Recent Labs Lab 03/21/16 2148 03/21/16 2241 03/22/16 0358 03/22/16 0931  CKTOTAL 63  --   --   --   TROPONINI  --  <0.03 <0.03 <0.03   BNP (last 3 results) No results for input(s): PROBNP in the last 8760 hours. HbA1C: No results for input(s): HGBA1C in the last 72 hours. CBG: No results for input(s): GLUCAP in the last 168  hours. Lipid Profile: No results for input(s): CHOL, HDL, LDLCALC, TRIG, CHOLHDL, LDLDIRECT in the last 72 hours. Thyroid Function Tests:  Recent Labs  03/21/16 2019 03/21/16 2352  TSH <0.010*  --   FREET4  --  5.22*   Anemia Panel: No results for input(s): VITAMINB12, FOLATE, FERRITIN, TIBC, IRON, RETICCTPCT in the last 72 hours. Urine analysis:    Component Value Date/Time   COLORURINE AMBER* 03/22/2016 1911   APPEARANCEUR TURBID* 03/22/2016 1911   LABSPEC 1.015 03/22/2016 1911   PHURINE 5.0 03/22/2016 1911   GLUCOSEU NEGATIVE 03/22/2016 1911   HGBUR SMALL* 03/22/2016 1911   BILIRUBINUR SMALL* 03/22/2016 1911   KETONESUR NEGATIVE 03/22/2016 1911   PROTEINUR >300* 03/22/2016  1911   NITRITE NEGATIVE 03/22/2016 1911   LEUKOCYTESUR NEGATIVE 03/22/2016 1911   Sepsis Labs: @LABRCNTIP (procalcitonin:4,lacticidven:4)    Recent Results (from the past 240 hour(s))  MRSA PCR Screening     Status: None   Collection Time: 03/22/16 12:30 AM  Result Value Ref Range Status   MRSA by PCR NEGATIVE NEGATIVE Final    Comment:        The GeneXpert MRSA Assay (FDA approved for NASAL specimens only), is one component of a comprehensive MRSA colonization surveillance program. It is not intended to diagnose MRSA infection nor to guide or monitor treatment for MRSA infections.       Radiology Studies: Dg Chest Port 1 View 03/21/2016  Mild congestive heart failure with small bilateral pleural effusions. Electronically Signed   By: Myles Rosenthal M.D.   On: 03/21/2016 21:43   US Abdomen Limited Ruq 03/21/2016 Minimally increased hepatic echotexture otherwise unremarkable right upper quadrant ultrasound. Partially visualized right pleural effusion. Electronically Signed   By: Elgie Collard M.D.   On: 03/21/2016 22:22   US Thyroid 03/22/2016  Thyromegaly. The thyroid tissue is heterogeneous but no discrete nodules.    Scheduled Meds: . digoxin  0.125 mg Oral Daily  . furosemide  80 mg  Intravenous BID  . methimazole  10 mg Oral Daily  . metoprolol tartrate  100 mg Oral BID  . potassium chloride  40 mEq Oral BID  . ramipril  1.25 mg Oral Daily  . rivaroxaban  20 mg Oral Q supper  . sodium chloride flush  3 mL Intravenous Q12H   Continuous Infusions: . diltiazem (CARDIZEM) infusion Stopped (03/22/16 1040)     LOS: 3 days    Time spent: 25 minutes    Manson Passey, MD [facility] Pager [phone]  If 7PM-7AM, please contact night-coverage [email or web address] Password [email or web address] 03/24/2016, 1:46 PM

## 2016-03-25 DIAGNOSIS — I10 Essential (primary) hypertension: Secondary | ICD-10-CM

## 2016-03-25 DIAGNOSIS — I5031 Acute diastolic (congestive) heart failure: Secondary | ICD-10-CM

## 2016-03-25 LAB — CBC
HEMATOCRIT: 36.3 % — AB (ref 39.0–52.0)
HEMOGLOBIN: 11.9 g/dL — AB (ref 13.0–17.0)
MCH: 23.1 pg — ABNORMAL LOW (ref 26.0–34.0)
MCHC: 32.8 g/dL (ref 30.0–36.0)
MCV: 70.5 fL — AB (ref 78.0–100.0)
Platelets: 173 10*3/uL (ref 150–400)
RBC: 5.15 MIL/uL (ref 4.22–5.81)
RDW: 15.8 % — ABNORMAL HIGH (ref 11.5–15.5)
WBC: 6.6 10*3/uL (ref 4.0–10.5)

## 2016-03-25 LAB — BASIC METABOLIC PANEL
ANION GAP: 10 (ref 5–15)
BUN: 12 mg/dL (ref 6–20)
CALCIUM: 8.4 mg/dL — AB (ref 8.9–10.3)
CO2: 27 mmol/L (ref 22–32)
Chloride: 105 mmol/L (ref 101–111)
Creatinine, Ser: 0.85 mg/dL (ref 0.61–1.24)
Glucose, Bld: 89 mg/dL (ref 65–99)
Potassium: 4.3 mmol/L (ref 3.5–5.1)
SODIUM: 142 mmol/L (ref 135–145)

## 2016-03-25 NOTE — Progress Notes (Signed)
Patient Name: Troy DapperJerome L Morris Date of Encounter: 03/25/2016  Primary Cardiologist: Dr. Eden EmmsNishan   Principal Problem:   Atrial fibrillation with RVR (HCC) Active Problems:   Elevated bilirubin   Hyperthyroidism   CHF (congestive heart failure) (HCC)   Fluid overload   LV dysfunction   Benign essential HTN   Acute diastolic CHF (congestive heart failure) (HCC)    SUBJECTIVE  Denies any CP or SOB.   CURRENT MEDS . digoxin  0.125 mg Oral Daily  . furosemide  80 mg Intravenous BID  . methimazole  10 mg Oral Daily  . metoprolol tartrate  100 mg Oral BID  . potassium chloride  40 mEq Oral BID  . ramipril  1.25 mg Oral Daily  . rivaroxaban  20 mg Oral Q supper  . sodium chloride flush  3 mL Intravenous Q12H    OBJECTIVE  Filed Vitals:   03/24/16 0727 03/24/16 1414 03/24/16 2104 03/25/16 0526  BP: 138/71 129/88 143/88 128/79  Pulse: 98 105 144 73  Temp: 97.9 F (36.6 C) 98.4 F (36.9 C) 98.3 F (36.8 C) 96.3 F (35.7 C)  TempSrc: Oral Oral Oral Oral  Resp: 26  18   Height:      Weight:    243 lb 2.7 oz (110.3 kg)  SpO2: 97% 96% 94% 98%    Intake/Output Summary (Last 24 hours) at 03/25/16 0932 Last data filed at 03/25/16 0900  Gross per 24 hour  Intake    843 ml  Output   3875 ml  Net  -3032 ml   Filed Weights   03/23/16 0400 03/24/16 0604 03/25/16 0526  Weight: 258 lb 12.8 oz (117.391 kg) 251 lb 3.2 oz (113.944 kg) 243 lb 2.7 oz (110.3 kg)    PHYSICAL EXAM  General: Pleasant, NAD. Neuro: Alert and oriented X 3. Moves all extremities spontaneously. Psych: Normal affect. HEENT:  Normal  Neck: Supple without bruits. No significant JVD Lungs:  Resp regular and unlabored. Decreased breath sound in bilateral bases Heart: irregular. no s3, s4, or murmurs. Abdomen: Soft, non-tender, non-distended, BS + x 4.  Extremities: No clubbing, cyanosis. DP/PT/Radials 2+ and equal bilaterally. 2+ pitting edema, worse on the R side  Accessory Clinical  Findings  CBC  Recent Labs  03/24/16 0452 03/25/16 0517  WBC 7.0 6.6  HGB 11.5* 11.9*  HCT 34.6* 36.3*  MCV 70.2* 70.5*  PLT 165 173   Basic Metabolic Panel  Recent Labs  03/24/16 0452 03/25/16 0517  NA 141 142  K 3.6 4.3  CL 104 105  CO2 27 27  GLUCOSE 85 89  BUN 15 12  CREATININE 0.87 0.85  CALCIUM 8.3* 8.4*   Liver Function Tests  Recent Labs  03/24/16 0452  AST 29  ALT 28  ALKPHOS 174*  BILITOT 3.1*  PROT 6.5  ALBUMIN 2.8*    TELE afib with HR 90s, occasionally go up to 140s    ECG  No new EKG  Echocardiogram 03/22/2016  LV EF: 20% - 25%  ------------------------------------------------------------------- Indications: CHF - 428.0.  ------------------------------------------------------------------- History: Risk factors: Hypertension.  ------------------------------------------------------------------- Study Conclusions  - Left ventricle: The cavity size was mildly dilated. There was  mild concentric hypertrophy. Systolic function was severely  reduced. The estimated ejection fraction was in the range of 20%  to 25%. Diffuse hypokinesis. - Ventricular septum: The contour showed diastolic flattening and  systolic flattening. - Aortic valve: Trileaflet; normal thickness leaflets. There was no  regurgitation. - Mitral valve: Structurally normal valve.  There was mild  regurgitation. - Left atrium: The atrium was moderately dilated. - Right ventricle: The cavity size was moderately dilated. Wall  thickness was normal. Systolic function was moderately reduced. - Right atrium: The atrium was moderately dilated. - Tricuspid valve: There was moderate regurgitation. - Pulmonic valve: There was no regurgitation. - Pulmonary arteries: Systolic pressure was within the normal  range. - Inferior vena cava: The vessel was normal in size. - Pericardium, extracardiac: There is mild to moderate pericardial  effusion  predominantly around the inferolateral walls.  Impressions:  - There is mild to moderate pericardial effusion predominantly  around the inferolateral walls. No signs of hemodynamic  compromise.    Radiology/Studies  Dg Chest Port 1 View  03/21/2016  CLINICAL DATA:  Shortness breath and tachycardia for 1 day. Atrial fibrillation with rapid ventricular response. EXAM: PORTABLE CHEST 1 VIEW COMPARISON:  None. FINDINGS: Moderate to severe cardiomegaly noted. Mild diffuse interstitial infiltrates seen, consistent with interstitial edema. No focal consolidation identified. Small bilateral pleural effusions also noted. IMPRESSION: Mild congestive heart failure with small bilateral pleural effusions. Electronically Signed   By: Myles Rosenthal M.D.   On: 03/21/2016 21:43   US Thyroid  03/22/2016  CLINICAL DATA:  Hyperthyroidism. EXAM: THYROID ULTRASOUND TECHNIQUE: Ultrasound examination of the thyroid gland and adjacent soft tissues was performed. COMPARISON:  None. FINDINGS: Right thyroid lobe Measurements: 8.5 x 3.6 x 4.2 cm. Right thyroid tissue is diffusely heterogeneous but there is not a discrete nodule or mass. Left thyroid lobe Measurements: 9.0 x 3.9 x 3.9 cm. Left thyroid tissue is diffusely heterogeneous without a discrete nodule. Isthmus Thickness: 1.3 cm.  No nodules visualized. Lymphadenopathy None visualized. IMPRESSION: Thyromegaly. The thyroid tissue is heterogeneous but no discrete nodules. Electronically Signed   By: Richarda Overlie M.D.   On: 03/22/2016 13:21   US Abdomen Limited Ruq  03/21/2016  CLINICAL DATA:  40 year old male with elevated bilirubin EXAM: US ABDOMEN LIMITED - RIGHT UPPER QUADRANT COMPARISON:  None. FINDINGS: Gallbladder: No gallstones or wall thickening visualized. No sonographic Murphy sign noted by sonographer. Common bile duct: Diameter: 3 mm Liver: Minimal increased hepatic echotexture. A right-sided pleural effusions partially visualized. IMPRESSION: Minimally increased  hepatic echotexture otherwise unremarkable right upper quadrant ultrasound. Partially visualized right pleural effusion. Electronically Signed   By: Elgie Collard M.D.   On: 03/21/2016 22:22    ASSESSMENT AND PLAN  1. Newly diagnosed afib with RVR in the setting of medication noncompliance and hyperthyroidism - Has not taken methimazole for over a month. - CHA2DS2-Vasc score 1 (HTN), given uncontrolled hyperthyroidism, chance of converting by himself is low, will not attempt TEE cardioversion while hyperthyroidism is not controlled. Started on Xarelto yesterday - Echo shows low EF 20-25%, mild LVH, mild MR, RVEF moderately reduced, moderate TR, mild to moderate pericardial effusion  - plan is to repeat echo in several month once HR is controlled, if EF still low, plan for cath.  - metoprolol XL 75mg  BID changed to metoprolol tartrate 100mg  BID yesterday, currently HR 90s which is probably the best we can achieve given hyperthyroidism.   2. Acute on chronic diastolic heart failure in the setting of afib of RVR - Echo 03/22/2016 EF 20-25%, diffuse hypokinesis, mild MR, RVEF moderately reduced, moderate TR, mild to moderate pericardial effusion  - currently on 80mg  BID IV lasix, weight down 10 lbs, Cr stable. Still has LE pitting edema worse on the RLE. Unclear how much of his swelling is related to myxedema, but would  continue IV lasix as long as his renal function can tolerate.   3. Hyperthyroidism: noncompliant with medication prior to admission  4. HTN  Signed, Azalee Course PA-C Pager: [phone]

## 2016-03-25 NOTE — Progress Notes (Addendum)
Patient ID: Troy Morris, male   DOB: [birth year], 40 y.o.   MRN: [id number]  PROGRESS NOTE    Troy Morris  [id number] DOB: [birth year] DOA: 03/21/2016  PCP: Lonia BloodGARBA,LAWAL, MD   Brief Narrative:  40 year old male with past medical history of hypertension and hyperthyroidism (diagnosed in 2007). He has been on methimazole and atenolol however has only been partially compliant with his medications. His PCP has recommended radiation therapies in the past for many years but due to insurance reasons he has not underwent the procedure.   Patient has not taken methimazole for the past month. He reports increasing shortness of breath with exertion over last week or so prior to this admission with occasional PND episodes. Over last 3 days prior to this admission he began to have mild productive cough and worsening lower extremity edema for which reason he sought care in urgent care. He was found to have tachycardia and was in atrial fibrillation and therefore sent to ED for evaluation. Patient reports occasionally having left-sided chest pain related to exertion which would last for a few seconds at the time and then spontaneously resolved with rest. He does not have chest pain at this time.  Upon arrival in the ED, he was hypertensive with systolic blood pressure in the 150s. Heart rate was in the 130s to 150s. His TSH was severely low at less than 0.01, free T4 was elevated at 5.22. Hemoglobin 12.5, Troponin negative, BNP 279, Potassium 3.4, ALP was 223, albumin 3.2, AST and ALT normal. Cardiology was consulted for atrial fibrillation with RVR in the setting of medication noncompliance and hyperthyroidism. He was started on Cardizem drip and heparin drip.   Assessment & Plan:   Newly diagnosed afib with RVR in the setting of medication noncompliance and hyperthyroidism - CHA2DS2-Vasc score 1 (HTN) prior to admission but now 2 as pt with findings of CHF on 2 D ECHO (EF 20%) - Continue xarelto for  anticoagulation  - Continue digoxin and metoprolol for HR control   Acute diastolic heart failure in the setting of afib of RVR - BNP 279. 2 D ECHO on this admission showed EF 20% - Continue digoxin 0.125 mg daily and ramipril 1.25 mg daily  - Continue Lasix IV twice a day, dose increased from 40 mg to 80 mg - Continue metoprolol 100 mg PO BID  - Weight in past 72 hours: 116.5 kg --> 117.3 kg --> 113.9 kg --> 110.3kg  - Continue daily weight and strict intake and output - Appreciate cardio following   Hyperthyroidism - Patient noncompliant with medication - Continue methimazole 10 mg daily, metoprolol 100 mg bid - Thyroid ultrasound without discrete nodules   Essential hypertension - Continue lasix, metoprolol and ramipril   Hypokalemia - Due to lasix - Supplemented and WNL  Elevated alkaline phosphatase - ALP elevated at 208 in addition to elevated total bilirubin 4.8; repeat ALP today 174, bilirubin 3.1 - AST and ALT are within normal limits - No acute findings on abdominal ultrasound   DVT prophylaxis: On xarelto Code Status: full code  Family Communication: No family at bedside Disposition Plan: Home once cleared by cardiology, likely by 5/13   Consultants:   Cardiology  Procedures:   ECHO 03/22/2016 - EF 20-25%  Antimicrobials:   None   Subjective: No overnight events.    Objective: Filed Vitals:   03/24/16 1414 03/24/16 2104 03/25/16 0526 03/25/16 1446  BP: 129/88 143/88 128/79 131/85  Pulse: 105 144 73 94  Temp:  98.4 F (36.9 C) 98.3 F (36.8 C) 96.3 F (35.7 C) 98.3 F (36.8 C)  TempSrc: Oral Oral Oral Oral  Resp:  18    Height:      Weight:   110.3 kg (243 lb 2.7 oz)   SpO2: 96% 94% 98% 99%    Intake/Output Summary (Last 24 hours) at 03/25/16 1602 Last data filed at 03/25/16 1459  Gross per 24 hour  Intake    963 ml  Output   3810 ml  Net  -2847 ml   Filed Weights   03/23/16 0400 03/24/16 0604 03/25/16 0526  Weight: 117.391 kg (258  lb 12.8 oz) 113.944 kg (251 lb 3.2 oz) 110.3 kg (243 lb 2.7 oz)    Examination:  General exam: no acute distress  Respiratory system: Bilateral air entry, no wheezing  Cardiovascular system: S1 & S2 (+), rate controlled  Gastrointestinal system: non tender, non distended, (+) BS Central nervous system: Nonfocal  Extremities: Strength 5/5 . (+1) pedal edema  Skin: no ulcers or lesions  Psychiatry: Normal insight, judgement   Data Reviewed: I have personally reviewed following labs and imaging studies  CBC:  Recent Labs Lab 03/21/16 2019 03/22/16 0359 03/23/16 0434 03/24/16 0452 03/25/16 0517  WBC 8.3 8.2 8.8 7.0 6.6  NEUTROABS 5.1 4.7  --   --   --   HGB 12.5* 11.8* 11.8* 11.5* 11.9*  HCT 36.8* 35.3* 35.0* 34.6* 36.3*  MCV 72.3* 70.6* 70.0* 70.2* 70.5*  PLT 187 194 178 165 173   Basic Metabolic Panel:  Recent Labs Lab 03/21/16 2019 03/22/16 0359 03/23/16 1241 03/24/16 0452 03/25/16 0517  NA 138 135 137 141 142  K 3.4* 3.7 3.4* 3.6 4.3  CL 106 103 105 104 105  CO2 21* 20* GLUCOSE 107* 111* 121* 85 89  BUN CREATININE 0.72 0.76 0.98 0.87 0.85  CALCIUM 8.8* 8.5* 8.9 8.3* 8.4*   GFR: Estimated Creatinine Clearance: 151.1 mL/min (by C-G formula based on Cr of 0.85). Liver Function Tests:  Recent Labs Lab 03/21/16 2019 03/22/16 0359 03/24/16 0452  AST ALT ALKPHOS 223* 208* 174*  BILITOT 4.7* 4.8* 3.1*  PROT 7.2 6.9 6.5  ALBUMIN 3.2* 3.1* 2.8*   No results for input(s): LIPASE, AMYLASE in the last 168 hours. No results for input(s): AMMONIA in the last 168 hours. Coagulation Profile:  Recent Labs Lab 03/21/16 2148 03/22/16 0359  INR 1.46 1.53*   Cardiac Enzymes:  Recent Labs Lab 03/21/16 2148 03/21/16 2241 03/22/16 0358 03/22/16 0931  CKTOTAL 63  --   --   --   TROPONINI  --  <0.03 <0.03 <0.03   BNP (last 3 results) No results for input(s): PROBNP in the last 8760 hours. HbA1C: No results for  input(s): HGBA1C in the last 72 hours. CBG: No results for input(s): GLUCAP in the last 168 hours. Lipid Profile: No results for input(s): CHOL, HDL, LDLCALC, TRIG, CHOLHDL, LDLDIRECT in the last 72 hours. Thyroid Function Tests: No results for input(s): TSH, T4TOTAL, FREET4, T3FREE, THYROIDAB in the last 72 hours. Anemia Panel: No results for input(s): VITAMINB12, FOLATE, FERRITIN, TIBC, IRON, RETICCTPCT in the last 72 hours. Urine analysis:    Component Value Date/Time   COLORURINE AMBER* 03/22/2016 1911   APPEARANCEUR TURBID* 03/22/2016 1911   LABSPEC 1.015 03/22/2016 1911   PHURINE 5.0 03/22/2016 1911   GLUCOSEU NEGATIVE 03/22/2016 1911   HGBUR SMALL* 03/22/2016  1911   BILIRUBINUR SMALL* 03/22/2016 1911   KETONESUR NEGATIVE 03/22/2016 1911   PROTEINUR >300* 03/22/2016 1911   NITRITE NEGATIVE 03/22/2016 1911   LEUKOCYTESUR NEGATIVE 03/22/2016 1911   Sepsis Labs: @LABRCNTIP (procalcitonin:4,lacticidven:4)    Recent Results (from the past 240 hour(s))  MRSA PCR Screening     Status: None   Collection Time: 03/22/16 12:30 AM  Result Value Ref Range Status   MRSA by PCR NEGATIVE NEGATIVE Final    Comment:        The GeneXpert MRSA Assay (FDA approved for NASAL specimens only), is one component of a comprehensive MRSA colonization surveillance program. It is not intended to diagnose MRSA infection nor to guide or monitor treatment for MRSA infections.       Radiology Studies: Dg Chest Port 1 View 03/21/2016  Mild congestive heart failure with small bilateral pleural effusions. Electronically Signed   By: Myles Rosenthal M.D.   On: 03/21/2016 21:43   US Abdomen Limited Ruq 03/21/2016 Minimally increased hepatic echotexture otherwise unremarkable right upper quadrant ultrasound. Partially visualized right pleural effusion. Electronically Signed   By: Elgie Collard M.D.   On: 03/21/2016 22:22   US Thyroid 03/22/2016  Thyromegaly. The thyroid tissue is heterogeneous but no  discrete nodules.    Scheduled Meds: . digoxin  0.125 mg Oral Daily  . furosemide  80 mg Intravenous BID  . methimazole  10 mg Oral Daily  . metoprolol tartrate  100 mg Oral BID  . potassium chloride  40 mEq Oral BID  . ramipril  1.25 mg Oral Daily  . rivaroxaban  20 mg Oral Q supper  . sodium chloride flush  3 mL Intravenous Q12H   Continuous Infusions: . diltiazem (CARDIZEM) infusion Stopped (03/22/16 1040)     LOS: 4 days    Time spent: 15 minutes    Manson Passey, MD [facility] Pager [phone]  If 7PM-7AM, please contact night-coverage [email or web address] Password [email or web address] 03/25/2016, 4:02 PM

## 2016-03-26 LAB — BASIC METABOLIC PANEL
ANION GAP: 10 (ref 5–15)
BUN: 11 mg/dL (ref 6–20)
CHLORIDE: 102 mmol/L (ref 101–111)
CO2: 27 mmol/L (ref 22–32)
Calcium: 8.5 mg/dL — ABNORMAL LOW (ref 8.9–10.3)
Creatinine, Ser: 0.75 mg/dL (ref 0.61–1.24)
GFR calc non Af Amer: >60 mL/min (ref >60)
Glucose, Bld: 78 mg/dL (ref 65–99)
POTASSIUM: 4.3 mmol/L (ref 3.5–5.1)
SODIUM: 139 mmol/L (ref 135–145)

## 2016-03-26 LAB — DIGOXIN LEVEL

## 2016-03-26 MED ORDER — FUROSEMIDE 40 MG PO TABS: 40.0000 mg | ORAL_TABLET | Freq: Two times a day (BID) | ORAL | Status: DC

## 2016-03-26 MED ORDER — DIGOXIN 125 MCG PO TABS: 0.1250 mg | ORAL_TABLET | Freq: Every day | ORAL | Status: DC

## 2016-03-26 MED ORDER — METOPROLOL TARTRATE 100 MG PO TABS: 100.0000 mg | ORAL_TABLET | Freq: Two times a day (BID) | ORAL | Status: DC

## 2016-03-26 MED ORDER — POTASSIUM CHLORIDE CRYS ER 20 MEQ PO TBCR: 20.0000 meq | EXTENDED_RELEASE_TABLET | Freq: Two times a day (BID) | ORAL | Status: DC

## 2016-03-26 MED ORDER — RAMIPRIL 2.5 MG PO CAPS: 2.5000 mg | ORAL_CAPSULE | Freq: Every day | ORAL | Status: DC

## 2016-03-26 MED ORDER — METHIMAZOLE 10 MG PO TABS: 10.0000 mg | ORAL_TABLET | Freq: Every day | ORAL | Status: DC

## 2016-03-26 MED ORDER — RIVAROXABAN 20 MG PO TABS: 20.0000 mg | ORAL_TABLET | Freq: Every day | ORAL | Status: DC

## 2016-03-26 MED ORDER — POTASSIUM CHLORIDE CRYS ER 20 MEQ PO TBCR: 40.0000 meq | EXTENDED_RELEASE_TABLET | Freq: Two times a day (BID) | ORAL | Status: DC

## 2016-03-26 NOTE — Progress Notes (Signed)
Patient Name: Troy Morris Date of Encounter: 03/26/2016  Primary Cardiologist: Dr. Eden Emms   Principal Problem:   Atrial fibrillation with RVR (HCC) Active Problems:   Elevated bilirubin   Hyperthyroidism   CHF (congestive heart failure) (HCC)   Fluid overload   LV dysfunction   Benign essential HTN   Acute diastolic CHF (congestive heart failure) (HCC)    SUBJECTIVE  Denies any CP or SOB. Feeling much better.  Has diuresed nicely    CURRENT MEDS . digoxin  0.125 mg Oral Daily  . methimazole  10 mg Oral Daily  . metoprolol tartrate  100 mg Oral BID  . potassium chloride  40 mEq Oral BID  . [START ON 03/27/2016] ramipril  2.5 mg Oral Daily  . rivaroxaban  20 mg Oral Q supper  . sodium chloride flush  3 mL Intravenous Q12H    OBJECTIVE  Filed Vitals:   03/25/16 0526 03/25/16 1446 03/25/16 2004 03/26/16 0500  BP: 128/79 131/85 145/73 127/79  Pulse: 73 94 113 72  Temp: 96.3 F (35.7 C) 98.3 F (36.8 C)  97.6 F (36.4 C)  TempSrc: Oral Oral  Oral  Resp:    20  Height:      Weight: 243 lb 2.7 oz (110.3 kg)   233 lb 4.8 oz (105.824 kg)  SpO2: 98% 99%  100%    Intake/Output Summary (Last 24 hours) at 03/26/16 1055 Last data filed at 03/26/16 1610  Gross per 24 hour  Intake   1440 ml  Output   5160 ml  Net  -3720 ml   Filed Weights   03/24/16 0604 03/25/16 0526 03/26/16 0500  Weight: 251 lb 3.2 oz (113.944 kg) 243 lb 2.7 oz (110.3 kg) 233 lb 4.8 oz (105.824 kg)    PHYSICAL EXAM  General: Pleasant, NAD. Neuro: Alert and oriented X 3. Moves all extremities spontaneously. Psych: Normal affect. HEENT:  Normal  Neck: Supple without bruits. No significant JVD Lungs:  Resp regular and unlabored. clear Heart: irregular. no s3, s4, or murmurs. Abdomen: Soft, non-tender, non-distended, BS + x 4.  Extremities: No clubbing, cyanosis. DP/PT/Radials 2+ and equal bilaterally.   Edema has resolved.   Accessory Clinical Findings  CBC  Recent Labs   03/24/16 0452 03/25/16 0517  WBC 7.0 6.6  HGB 11.5* 11.9*  HCT 34.6* 36.3*  MCV 70.2* 70.5*  PLT 165 173   Basic Metabolic Panel  Recent Labs  03/25/16 0517 03/26/16 0611  NA 142 139  K 4.3 4.3  CL 105 102  CO2 27 27  GLUCOSE 89 78  BUN 12 11  CREATININE 0.85 0.75  CALCIUM 8.4* 8.5*   Liver Function Tests  Recent Labs  03/24/16 0452  AST 29  ALT 28  ALKPHOS 174*  BILITOT 3.1*  PROT 6.5  ALBUMIN 2.8*    TELE afib with HR 90s, occasionally go up to 140s    ECG  No new EKG  Echocardiogram 03/22/2016  LV EF: 20% - 25%  ------------------------------------------------------------------- Indications: CHF - 428.0.  ------------------------------------------------------------------- History: Risk factors: Hypertension.  ------------------------------------------------------------------- Study Conclusions  - Left ventricle: The cavity size was mildly dilated. There was  mild concentric hypertrophy. Systolic function was severely  reduced. The estimated ejection fraction was in the range of 20%  to 25%. Diffuse hypokinesis. - Ventricular septum: The contour showed diastolic flattening and  systolic flattening. - Aortic valve: Trileaflet; normal thickness leaflets. There was no  regurgitation. - Mitral valve: Structurally normal valve. There was mild  regurgitation. - Left atrium: The atrium was moderately dilated. - Right ventricle: The cavity size was moderately dilated. Wall  thickness was normal. Systolic function was moderately reduced. - Right atrium: The atrium was moderately dilated. - Tricuspid valve: There was moderate regurgitation. - Pulmonic valve: There was no regurgitation. - Pulmonary arteries: Systolic pressure was within the normal  range. - Inferior vena cava: The vessel was normal in size. - Pericardium, extracardiac: There is mild to moderate pericardial  effusion predominantly around the inferolateral  walls.  Impressions:  - There is mild to moderate pericardial effusion predominantly  around the inferolateral walls. No signs of hemodynamic  compromise.    Radiology/Studies  Dg Chest Port 1 View  03/21/2016  CLINICAL DATA:  Shortness breath and tachycardia for 1 day. Atrial fibrillation with rapid ventricular response. EXAM: PORTABLE CHEST 1 VIEW COMPARISON:  None. FINDINGS: Moderate to severe cardiomegaly noted. Mild diffuse interstitial infiltrates seen, consistent with interstitial edema. No focal consolidation identified. Small bilateral pleural effusions also noted. IMPRESSION: Mild congestive heart failure with small bilateral pleural effusions. Electronically Signed   By: Myles Rosenthal M.D.   On: 03/21/2016 21:43   US Thyroid  03/22/2016  CLINICAL DATA:  Hyperthyroidism. EXAM: THYROID ULTRASOUND TECHNIQUE: Ultrasound examination of the thyroid gland and adjacent soft tissues was performed. COMPARISON:  None. FINDINGS: Right thyroid lobe Measurements: 8.5 x 3.6 x 4.2 cm. Right thyroid tissue is diffusely heterogeneous but there is not a discrete nodule or mass. Left thyroid lobe Measurements: 9.0 x 3.9 x 3.9 cm. Left thyroid tissue is diffusely heterogeneous without a discrete nodule. Isthmus Thickness: 1.3 cm.  No nodules visualized. Lymphadenopathy None visualized. IMPRESSION: Thyromegaly. The thyroid tissue is heterogeneous but no discrete nodules. Electronically Signed   By: Richarda Overlie M.D.   On: 03/22/2016 13:21   US Abdomen Limited Ruq  03/21/2016  CLINICAL DATA:  40 year old male with elevated bilirubin EXAM: US ABDOMEN LIMITED - RIGHT UPPER QUADRANT COMPARISON:  None. FINDINGS: Gallbladder: No gallstones or wall thickening visualized. No sonographic Murphy sign noted by sonographer. Common bile duct: Diameter: 3 mm Liver: Minimal increased hepatic echotexture. A right-sided pleural effusions partially visualized. IMPRESSION: Minimally increased hepatic echotexture otherwise  unremarkable right upper quadrant ultrasound. Partially visualized right pleural effusion. Electronically Signed   By: Elgie Collard M.D.   On: 03/21/2016 22:22    ASSESSMENT AND PLAN  1. Newly diagnosed afib with RVR in the setting of medication noncompliance and hyperthyroidism - Has not taken methimazole for over a month. - CHA2DS2-Vasc score 1 (HTN), given uncontrolled hyperthyroidism, chance of converting by himself is low, will not attempt TEE cardioversion while hyperthyroidism is not controlled. Started on Xarelto yesterday - Echo shows low EF 20-25%, mild LVH, mild MR, RVEF moderately reduced, moderate TR, mild to moderate pericardial effusion  - plan is to repeat echo in several month once HR is controlled, if EF still low, plan for cath.   2. Acute on chronic diastolic heart failure in the setting of afib of RVR - Echo 03/22/2016 EF 20-25%, diffuse hypokinesis, mild MR, RVEF moderately reduced, moderate TR, mild to moderate pericardial effusion  I have changed the IV lasix to PO lasix  I have increase the ramipril to 2.5 a day    3. Hyperthyroidism: noncompliant with medication prior to admission Is back on methimazol and is also on metoprolol which will help   4. HTN  He should be able to go home today .   Clinically he is markedly improved.  She should see cardiology in a week to continue uptitration of his CHF meds.  And to check BMP   Kristeen MissPhilip Nahser, MD  03/26/2016 10:58 AM    [facility] [address],  [address] [place], [place]  [address] Pager [phone] Phone: [phone]; Fax: [phone]

## 2016-03-26 NOTE — Discharge Summary (Signed)
Physician Discharge Summary  Troy Morris:[id number] DOB: 11-Apr-1976 DOA: 03/21/2016  PCP: Lonia Blood, MD  Admit date: 03/21/2016 Discharge date: 03/26/2016  Recommendations for Outpatient Follow-up:  Please note your xarelto is only once a day regimen 20 mg daily. Continue meds as prescribed. Follow up with cardio per scheduled appointment.  Discharge Diagnoses:  Principal Problem:   Atrial fibrillation with RVR (HCC) Active Problems:   Elevated bilirubin   Hyperthyroidism   CHF (congestive heart failure) (HCC)   Fluid overload   LV dysfunction   Benign essential HTN   Acute diastolic CHF (congestive heart failure) (HCC)    Discharge Condition: stable   Diet recommendation: as tolerated   History of present illness:  40 year old male with past medical history of hypertension and hyperthyroidism (diagnosed in 2007). He has been on methimazole and atenolol however has only been partially compliant with his medications. His PCP has recommended radiation therapies in the past for many years but due to insurance reasons he has not underwent the procedure.   Patient has not taken methimazole for the past month. He reports increasing shortness of breath with exertion over last week or so prior to this admission with occasional PND episodes. Over last 3 days prior to this admission he began to have mild productive cough and worsening lower extremity edema for which reason he sought care in urgent care. He was found to have tachycardia and was in atrial fibrillation and therefore sent to ED for evaluation. Patient reports occasionally having left-sided chest pain related to exertion which would last for a few seconds at the time and then spontaneously resolved with rest. He does not have chest pain at this time.  Upon arrival in the ED, he was hypertensive with systolic blood pressure in the 150s. Heart rate was in the 130s to 150s. His TSH was severely low at less than 0.01, free T4  was elevated at 5.22. Hemoglobin 12.5, Troponin negative, BNP 279, Potassium 3.4, ALP was 223, albumin 3.2, AST and ALT normal. Cardiology was consulted for atrial fibrillation with RVR in the setting of medication noncompliance and hyperthyroidism. He was started on Cardizem drip and heparin drip.  Hospital Course:   Assessment & Plan:  Newly diagnosed afib with RVR in the setting of medication noncompliance and hyperthyroidism - CHA2DS2-Vasc score 1 (HTN) prior to admission but now 2 as pt with findings of CHF on 2 D ECHO (EF 20%) - Continue xarelto for anticoagulation  - Continue digoxin and metoprolol for HR control   Acute diastolic heart failure in the setting of afib of RVR - BNP 279. 2 D ECHO on this admission showed EF 20% - Continue digoxin 0.125 mg daily and ramipril 2.5 mg daily  - Continue Lasix 40 mg PO BID on discharge  - Continue metoprolol 100 mg PO BID  - Weight in past few days: 116.5 kg --> 117.3 kg --> 113.9 kg --> 110.3kg --> 105.8kg - Appreciate cardio recommendations   Hyperthyroidism - Patient noncompliant with medication - Continue methimazole 10 mg daily, metoprolol 100 mg bid - Thyroid ultrasound without discrete nodules   Essential hypertension - Continue lasix 40 mg BID, metoprolol 100 mg BID and ramipril 2.5 mg daily on discharge as prescribed   Hypokalemia - Due to lasix - Continue to supplement 20 meq BID  Elevated alkaline phosphatase - ALP elevated at 208 in addition to elevated total bilirubin 4.8; repeat ALP today 174, bilirubin 3.1 - AST and ALT are within normal limits - No  acute findings on abdominal ultrasound   DVT prophylaxis: On xarelto Code Status: full code  Family Communication: wife at bedside    Consultants:   Cardiology  Procedures:   ECHO 03/22/2016 - EF 20-25%  Antimicrobials:   None   Signed:  Manson Passey, MD  [facility] 03/26/2016, 11:42 AM  Pager #: [phone]  Time spent in  minutes: more than 30 minutes    Discharge Exam: Filed Vitals:   03/25/16 2004 03/26/16 0500  BP: 145/73 127/79  Pulse: 113 72  Temp:  97.6 F (36.4 C)  Resp:  20   Filed Vitals:   03/25/16 0526 03/25/16 1446 03/25/16 2004 03/26/16 0500  BP: 128/79 131/85 145/73 127/79  Pulse: 73 94 113 72  Temp: 96.3 F (35.7 C) 98.3 F (36.8 C)  97.6 F (36.4 C)  TempSrc: Oral Oral  Oral  Resp:    20  Height:      Weight: 110.3 kg (243 lb 2.7 oz)   105.824 kg (233 lb 4.8 oz)  SpO2: 98% 99%  100%    General: Pt is alert, follows commands appropriately, not in acute distress Cardiovascular: Rate controlled, irregular rhythm, S1/S2 +, no murmurs Respiratory: Clear to auscultation bilaterally, no wheezing, no crackles, no rhonchi Abdominal: Soft, non tender, non distended, bowel sounds +, no guarding Extremities: Improving LE edema, no cyanosis, pulses palpable bilaterally DP and PT Neuro: Grossly nonfocal  Discharge Instructions  Discharge Instructions    Call MD for:  difficulty breathing, headache or visual disturbances    Complete by:  As directed      Call MD for:  persistant dizziness or light-headedness    Complete by:  As directed      Call MD for:  persistant nausea and vomiting    Complete by:  As directed      Call MD for:  severe uncontrolled pain    Complete by:  As directed      Diet - low sodium heart healthy    Complete by:  As directed      Discharge instructions    Complete by:  As directed   Please note your xarelto is only once a day regimen 20 mg daily. Continue meds as prescribed. Follow up with cardio per scheduled appointment.     Increase activity slowly    Complete by:  As directed             Medication List    STOP taking these medications        atenolol 25 MG tablet  Commonly known as:  TENORMIN     MUCINEX FAST-MAX 10-650-400 MG/20ML Liqd  Generic drug:  Phenylephrine-APAP-Guaifenesin      TAKE these medications        digoxin 0.125  MG tablet  Commonly known as:  LANOXIN  Take 1 tablet (0.125 mg total) by mouth daily.     furosemide 40 MG tablet  Commonly known as:  LASIX  Take 1 tablet (40 mg total) by mouth 2 (two) times daily.     ibuprofen 200 MG tablet  Commonly known as:  ADVIL,MOTRIN  Take 200 mg by mouth every 6 (six) hours as needed for headache.     methimazole 10 MG tablet  Commonly known as:  TAPAZOLE  Take 1 tablet (10 mg total) by mouth daily.     metoprolol 100 MG tablet  Commonly known as:  LOPRESSOR  Take 1 tablet (100 mg total) by mouth 2 (two) times daily.  potassium chloride SA 20 MEQ tablet  Commonly known as:  K-DUR,KLOR-CON  Take 1 tablet (20 mEq total) by mouth 2 (two) times daily.     ramipril 2.5 MG capsule  Commonly known as:  ALTACE  Take 1 capsule (2.5 mg total) by mouth daily.  Start taking on:  03/27/2016     rivaroxaban 20 MG Tabs tablet  Commonly known as:  XARELTO  Take 1 tablet (20 mg total) by mouth daily with supper.            Follow-up Information    Follow up with [facility].   Specialty:  Internal Medicine   Why:  Apr 12, 2016, Tuesday 9:00 am   Contact information:   [address] [place] [id number] [phone]      Follow up with Lonia BloodGARBA,LAWAL, MD. Schedule an appointment as soon as possible for a visit in 2 weeks.   Specialty:  Internal Medicine   Why:  Follow up appt after recent hospitalization   Contact information:   [address] [place] [address] [phone]        The results of significant diagnostics from this hospitalization (including imaging, microbiology, ancillary and laboratory) are listed below for reference.    Significant Diagnostic Studies: Dg Chest Port 1 View  03/21/2016  CLINICAL DATA:  Shortness breath and tachycardia for 1 day. Atrial fibrillation with rapid ventricular response. EXAM: PORTABLE CHEST 1 VIEW COMPARISON:  None. FINDINGS: Moderate to severe cardiomegaly  noted. Mild diffuse interstitial infiltrates seen, consistent with interstitial edema. No focal consolidation identified. Small bilateral pleural effusions also noted. IMPRESSION: Mild congestive heart failure with small bilateral pleural effusions. Electronically Signed   By: Myles RosenthalJohn  Stahl M.D.   On: 03/21/2016 21:43   [place] Thyroid  03/22/2016  CLINICAL DATA:  Hyperthyroidism. EXAM: THYROID ULTRASOUND TECHNIQUE: Ultrasound examination of the thyroid gland and adjacent soft tissues was performed. COMPARISON:  None. FINDINGS: Right thyroid lobe Measurements: 8.5 x 3.6 x 4.2 cm. Right thyroid tissue is diffusely heterogeneous but there is not a discrete nodule or mass. Left thyroid lobe Measurements: 9.0 x 3.9 x 3.9 cm. Left thyroid tissue is diffusely heterogeneous without a discrete nodule. Isthmus Thickness: 1.3 cm.  No nodules visualized. Lymphadenopathy None visualized. IMPRESSION: Thyromegaly. The thyroid tissue is heterogeneous but no discrete nodules. Electronically Signed   By: Richarda OverlieAdam  Henn M.D.   On: 03/22/2016 13:21   [place] Abdomen Limited Ruq  03/21/2016  CLINICAL DATA:  40 year old male with elevated bilirubin EXAM: US ABDOMEN LIMITED - RIGHT UPPER QUADRANT COMPARISON:  None. FINDINGS: Gallbladder: No gallstones or wall thickening visualized. No sonographic Murphy sign noted by sonographer. Common bile duct: Diameter: 3 mm Liver: Minimal increased hepatic echotexture. A right-sided pleural effusions partially visualized. IMPRESSION: Minimally increased hepatic echotexture otherwise unremarkable right upper quadrant ultrasound. Partially visualized right pleural effusion. Electronically Signed   By: Elgie CollardArash  Radparvar M.D.   On: 03/21/2016 22:22    Microbiology: Recent Results (from the past 240 hour(s))  MRSA PCR Screening     Status: None   Collection Time: 03/22/16 12:30 AM  Result Value Ref Range Status   MRSA by PCR NEGATIVE NEGATIVE Final    Comment:        The GeneXpert MRSA Assay (FDA approved  for NASAL specimens only), is one component of a comprehensive MRSA colonization surveillance program. It is not intended to diagnose MRSA infection nor to guide or monitor treatment for MRSA infections.  Labs: Basic Metabolic Panel:  Recent Labs Lab 03/22/16 0359 03/23/16 1241 03/24/16 0452 03/25/16 0517 03/26/16 0611  NA 135 137 141 142 139  K 3.7 3.4* 3.6 4.3 4.3  CL 103 105 104 105 102  CO2 20* 23 27 27 27   GLUCOSE 111* 121* 85 89 78  BUN 11 19 15 12 11   CREATININE 0.76 0.98 0.87 0.85 0.75  CALCIUM 8.5* 8.9 8.3* 8.4* 8.5*   Liver Function Tests:  Recent Labs Lab 03/21/16 2019 03/22/16 0359 03/24/16 0452  AST 28 23 29   ALT 24 23 28   ALKPHOS 223* 208* 174*  BILITOT 4.7* 4.8* 3.1*  PROT 7.2 6.9 6.5  ALBUMIN 3.2* 3.1* 2.8*   No results for input(s): LIPASE, AMYLASE in the last 168 hours. No results for input(s): AMMONIA in the last 168 hours. CBC:  Recent Labs Lab 03/21/16 2019 03/22/16 0359 03/23/16 0434 03/24/16 0452 03/25/16 0517  WBC 8.3 8.2 8.8 7.0 6.6  NEUTROABS 5.1 4.7  --   --   --   HGB 12.5* 11.8* 11.8* 11.5* 11.9*  HCT 36.8* 35.3* 35.0* 34.6* 36.3*  MCV 72.3* 70.6* 70.0* 70.2* 70.5*  PLT 187 194 178 165 173   Cardiac Enzymes:  Recent Labs Lab 03/21/16 2148 03/21/16 2241 03/22/16 0358 03/22/16 0931  CKTOTAL 63  --   --   --   TROPONINI  --  <0.03 <0.03 <0.03   BNP: BNP (last 3 results)  Recent Labs  03/21/16 2019  BNP 279.4*    ProBNP (last 3 results) No results for input(s): PROBNP in the last 8760 hours.  CBG: No results for input(s): GLUCAP in the last 168 hours.

## 2016-04-05 ENCOUNTER — Telehealth: Payer: Self-pay

## 2016-04-05 NOTE — Telephone Encounter (Signed)
Letter of approval received from [employer] [facility] for Xarelto 20mg . He will receive notification and a pharmacy card for this.

## 2016-04-12 ENCOUNTER — Ambulatory Visit: Payer: Self-pay | Admitting: Family Medicine

## 2016-07-04 IMAGING — US US THYROID
1 series · 14 of 25 positions shown · non-contrast
Comparison: None.

CLINICAL DATA: Hyperthyroidism.

EXAM:
THYROID ULTRASOUND
TECHNIQUE: Ultrasound examination of the thyroid gland and adjacent soft
tissues was performed.

[Series 1: us thyroid · 0.08mm/px · 14 of 35 slices shown]
[im 1/35]
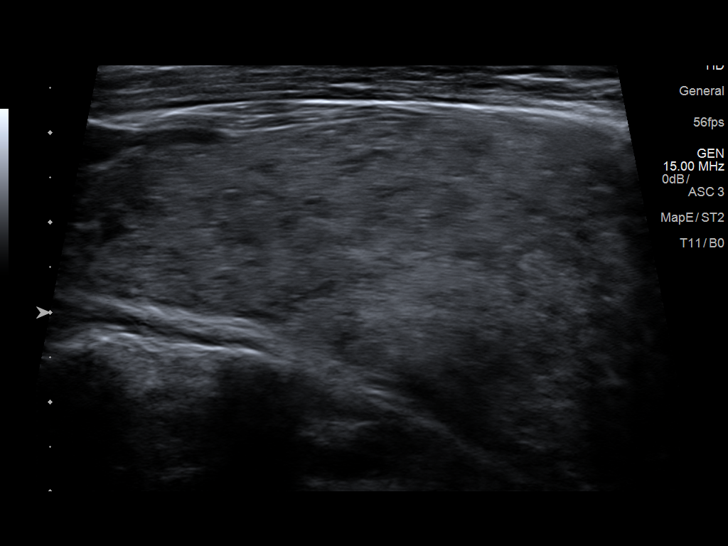
[im 3/35]
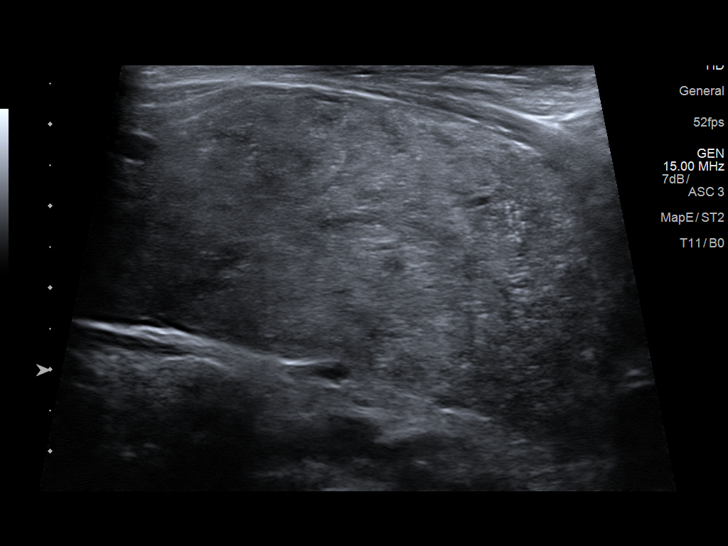
[im 6/35]
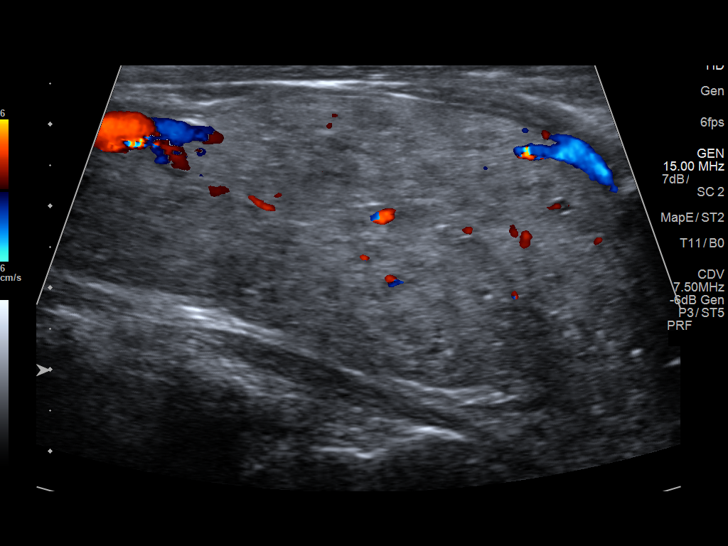
[im 9/35]
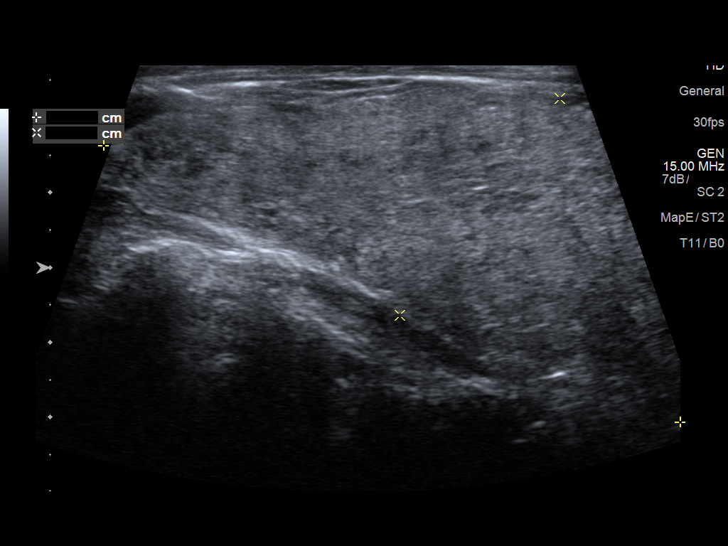
[im 12/35]
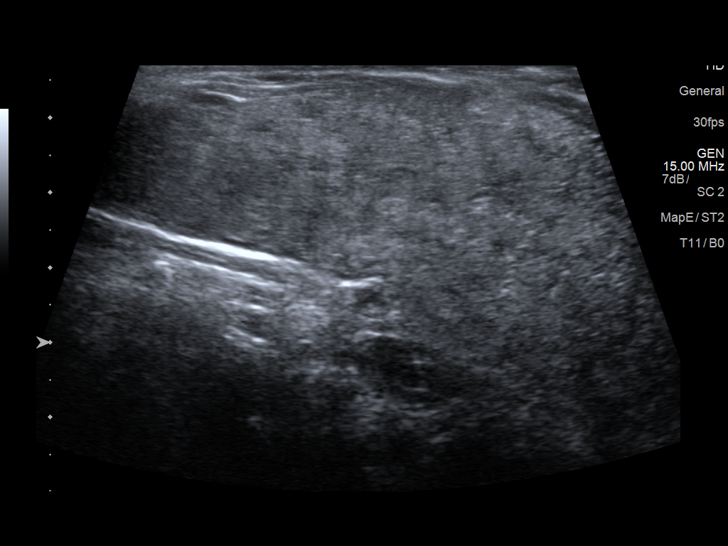
[im 13/35]
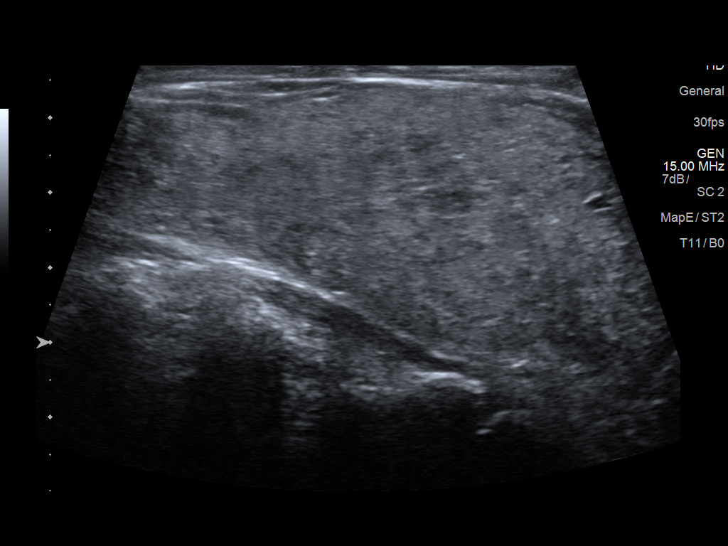
[im 16/35]
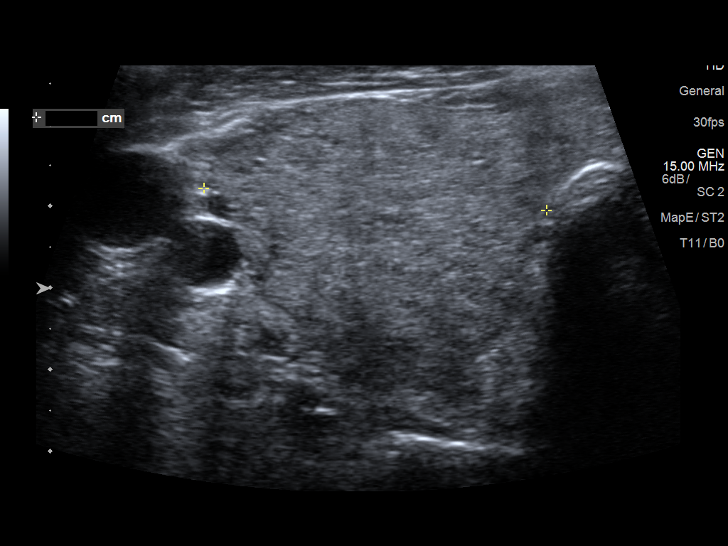
[im 19/35]
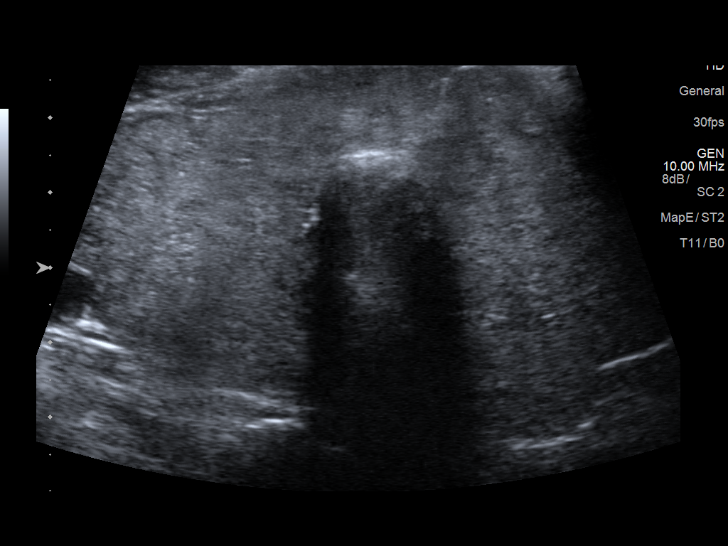
[im 22/35]
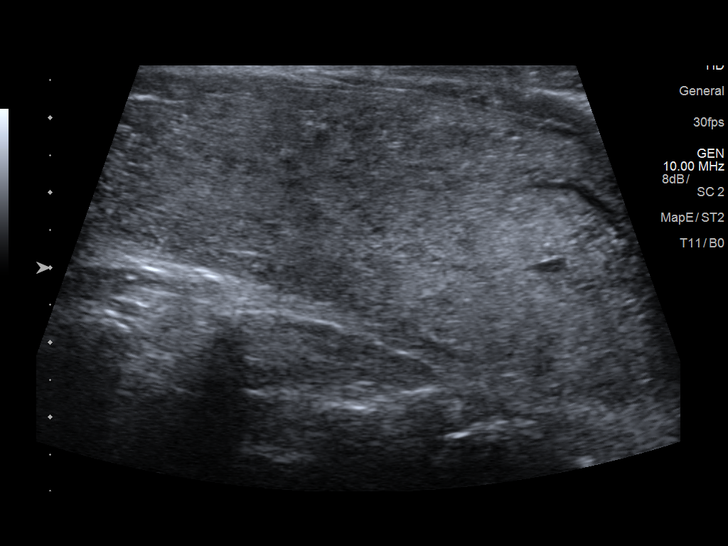
[im 23/35]
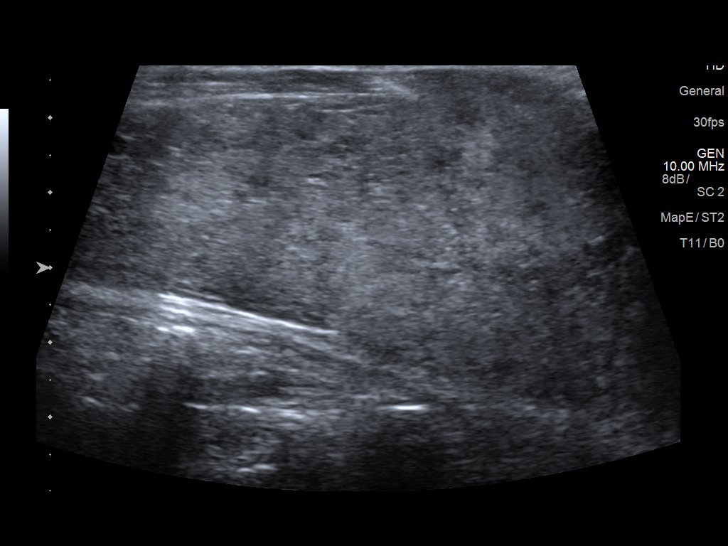
[im 26/35]
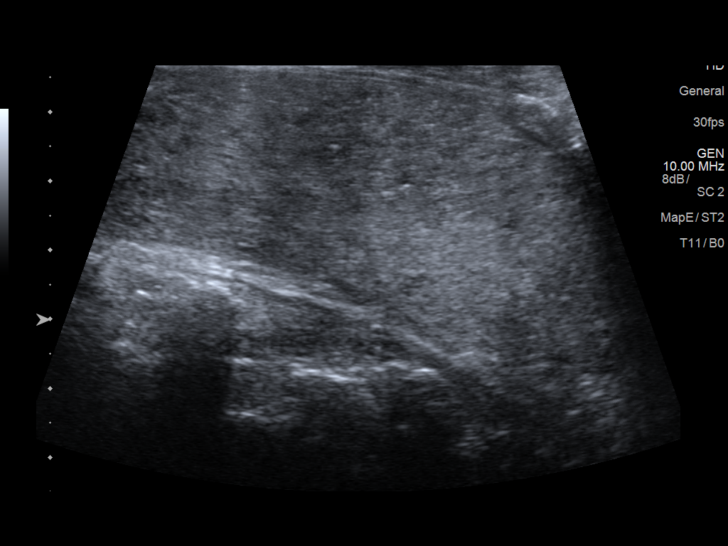
[im 29/35]
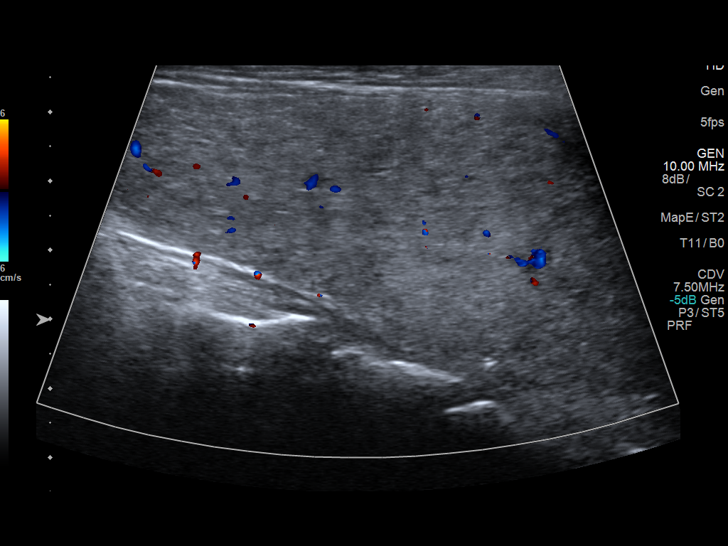
[im 32/35]
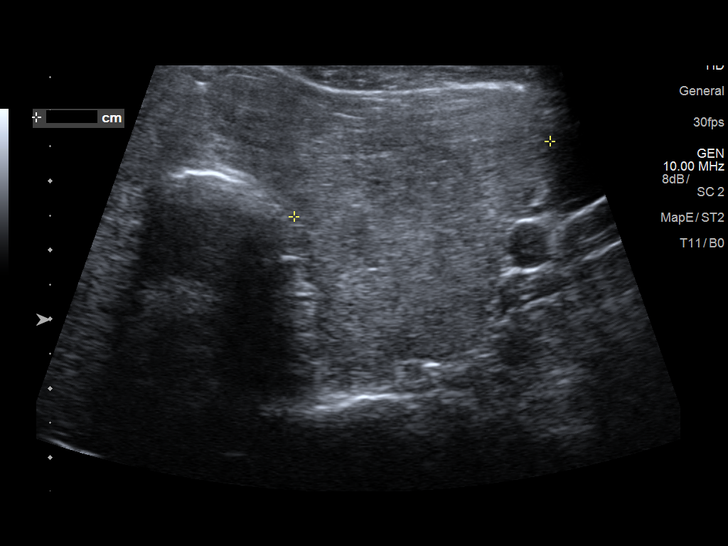
[im 35/35]
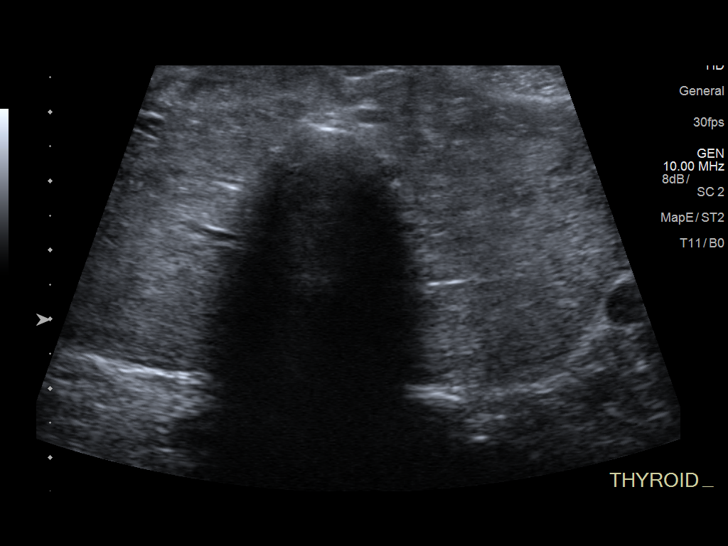

[14 of 25 positions shown; findings below may reference images not displayed]

FINDINGS: Right thyroid lobe

Measurements: 8.5 x 3.6 x 4.2 cm. Right thyroid tissue is diffusely
heterogeneous but there is not a discrete nodule or mass.

Left thyroid lobe

Measurements: 9.0 x 3.9 x 3.9 cm. Left thyroid tissue is diffusely
heterogeneous without a discrete nodule.

Isthmus

Thickness: 1.3 cm.  No nodules visualized.

Lymphadenopathy

None visualized.
IMPRESSION: Thyromegaly. The thyroid tissue is heterogeneous but no discrete
nodules.

## 2017-07-19 DIAGNOSIS — R55 Syncope and collapse: Secondary | ICD-10-CM | POA: Diagnosis not present

## 2017-07-19 DIAGNOSIS — I4891 Unspecified atrial fibrillation: Secondary | ICD-10-CM | POA: Diagnosis not present

## 2017-07-19 DIAGNOSIS — E0590 Thyrotoxicosis, unspecified without thyrotoxic crisis or storm: Secondary | ICD-10-CM | POA: Diagnosis not present

## 2017-07-19 DIAGNOSIS — I10 Essential (primary) hypertension: Secondary | ICD-10-CM | POA: Diagnosis not present

## 2017-09-22 DIAGNOSIS — Z23 Encounter for immunization: Secondary | ICD-10-CM | POA: Diagnosis not present

## 2017-10-05 IMAGING — CR DG CHEST 1V PORT
1 series · 1 of 1 positions shown · non-contrast
Comparison: None.

CLINICAL DATA: Shortness breath and tachycardia for 1 day. Atrial
fibrillation with rapid ventricular response.

EXAM:
PORTABLE CHEST 1 VIEW

[AP]
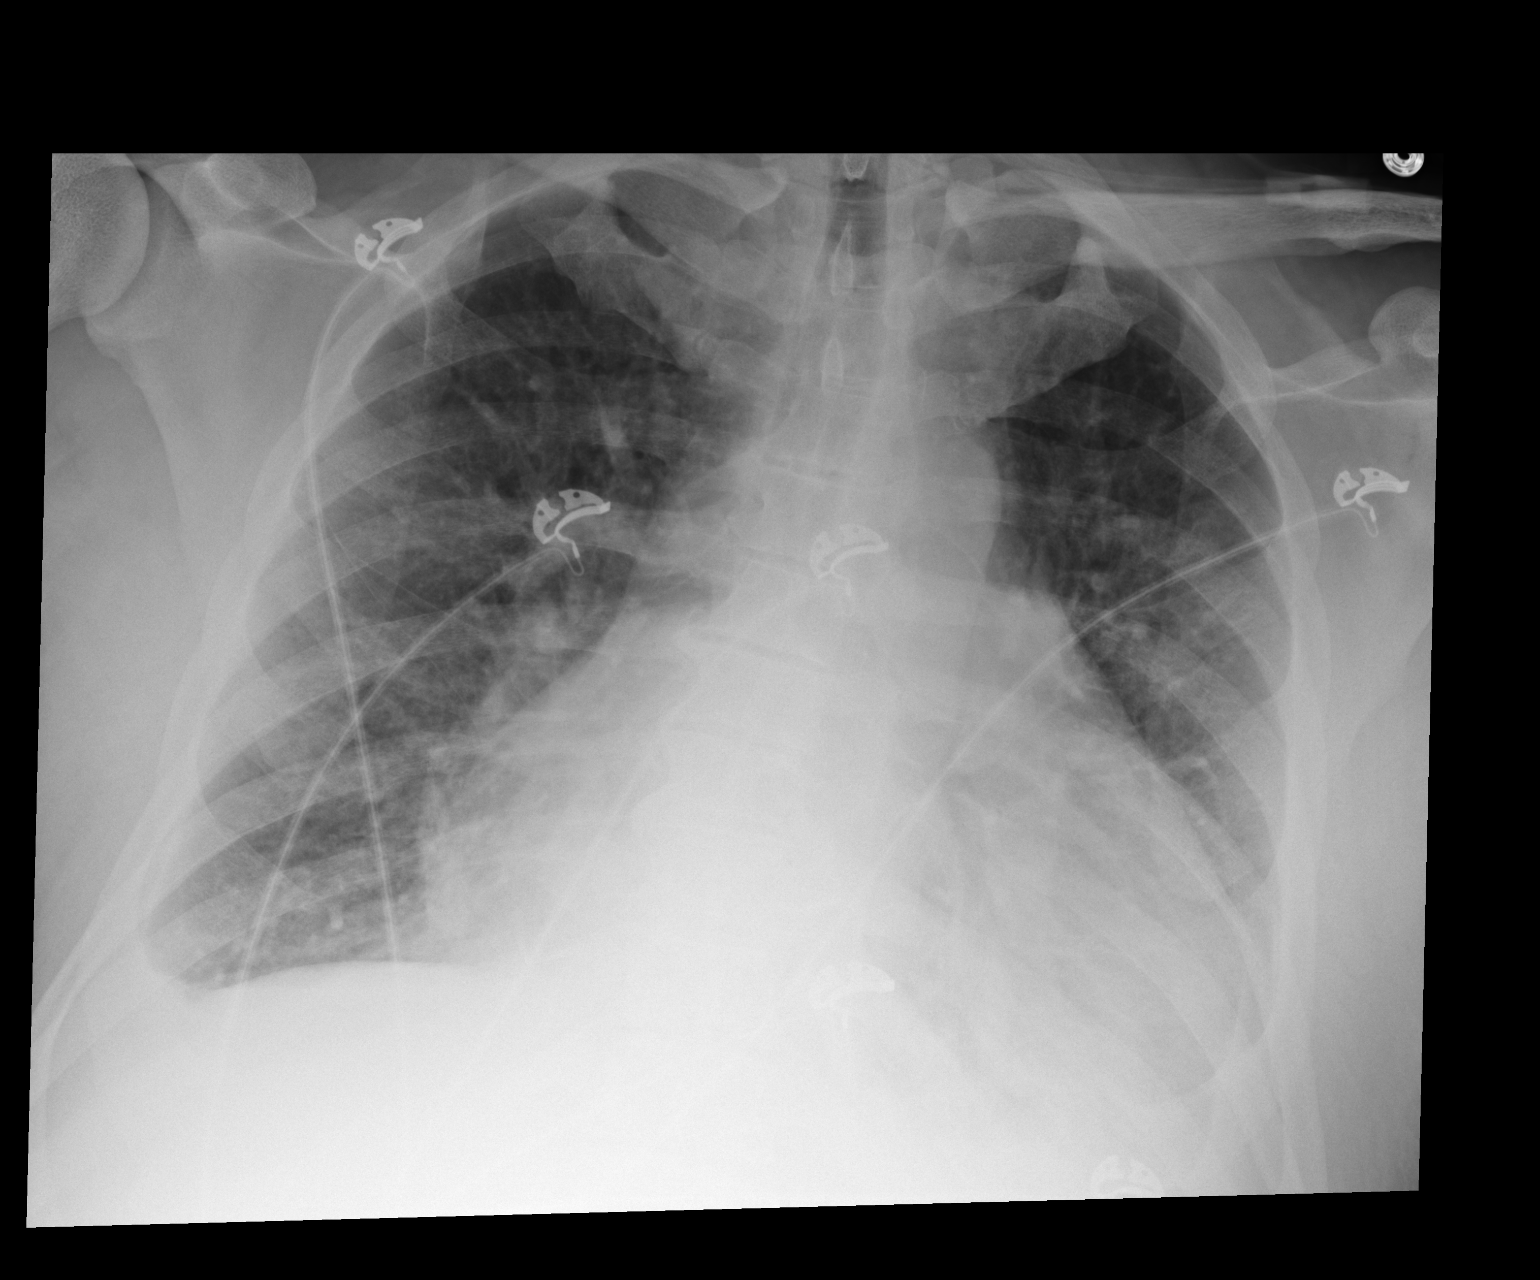

[1 of 1 positions shown; findings below may reference images not displayed]

FINDINGS: Moderate to severe cardiomegaly noted. Mild diffuse interstitial
infiltrates seen, consistent with interstitial edema. No focal
consolidation identified. Small bilateral pleural effusions also
noted.
IMPRESSION: Mild congestive heart failure with small bilateral pleural
effusions.

## 2018-08-10 DIAGNOSIS — E0590 Thyrotoxicosis, unspecified without thyrotoxic crisis or storm: Secondary | ICD-10-CM | POA: Diagnosis not present

## 2018-08-10 DIAGNOSIS — I10 Essential (primary) hypertension: Secondary | ICD-10-CM | POA: Diagnosis not present

## 2018-08-10 DIAGNOSIS — I480 Paroxysmal atrial fibrillation: Secondary | ICD-10-CM | POA: Diagnosis not present

## 2018-08-23 DIAGNOSIS — I480 Paroxysmal atrial fibrillation: Secondary | ICD-10-CM | POA: Diagnosis not present

## 2018-08-31 DIAGNOSIS — I10 Essential (primary) hypertension: Secondary | ICD-10-CM | POA: Diagnosis not present

## 2018-08-31 DIAGNOSIS — I480 Paroxysmal atrial fibrillation: Secondary | ICD-10-CM | POA: Diagnosis not present

## 2018-08-31 DIAGNOSIS — E0590 Thyrotoxicosis, unspecified without thyrotoxic crisis or storm: Secondary | ICD-10-CM | POA: Diagnosis not present

## 2019-03-11 ENCOUNTER — Encounter: Payer: Self-pay | Admitting: Cardiology

## 2019-03-12 ENCOUNTER — Encounter: Payer: Self-pay | Admitting: Cardiology

## 2019-03-12 ENCOUNTER — Ambulatory Visit: Payer: BLUE CROSS/BLUE SHIELD | Admitting: Cardiology

## 2019-03-12 ENCOUNTER — Other Ambulatory Visit: Payer: Self-pay

## 2019-03-12 VITALS — BP 154/86 | Ht 74.0 in | Wt 265.0 lb

## 2019-03-12 DIAGNOSIS — I10 Essential (primary) hypertension: Secondary | ICD-10-CM

## 2019-03-12 DIAGNOSIS — Z8679 Personal history of other diseases of the circulatory system: Secondary | ICD-10-CM | POA: Diagnosis not present

## 2019-03-12 DIAGNOSIS — E0590 Thyrotoxicosis, unspecified without thyrotoxic crisis or storm: Secondary | ICD-10-CM | POA: Diagnosis not present

## 2019-03-12 DIAGNOSIS — R0609 Other forms of dyspnea: Secondary | ICD-10-CM

## 2019-03-12 DIAGNOSIS — R0600 Dyspnea, unspecified: Secondary | ICD-10-CM

## 2019-03-12 MED ORDER — TRIAMTERENE-HCTZ 37.5-25 MG PO TABS: 1.0000 | ORAL_TABLET | ORAL | 2 refills | Status: DC

## 2019-03-12 NOTE — Progress Notes (Signed)
Primary Physician/Referring:  Troy Emery, MD  Patient ID: Troy Morris, male    DOB: December 26, 1975, 43 y.o.   MRN: [id number]  Chief Complaint  Patient presents with  . Heart Murmur  . Follow-up    26yr.Marland KitchenMarland KitchenC/o edema and SOB    HPI: Troy Morris  is a 43 y.o. male  with hyperthyroidism, previously on methimazole and atenolol, however had ran out of his medications for one month and presented to the hospital on 03/21/2016 with tachycardia, coughing, and shortness of breath. Radiation therapy has been recommended in the past, however he has never proceeded with this option due to financial concerns. On admission, he was noted to be in A. fib with RVR with accelerated hypertension. Echocardiogram during hospitalization revealed markedly reduced LVEF at 20%.   After restarting thyroid therapy, and treatment of hypertension, EF normalized by echo in Sept 2019 and since then has not had any CHF episodes. He now presents for f.u at 1 year. There has not been recurrence of atrial fibrillation and he is not on anticoagulation due to low cardioembolic risk.    He has not seen a physician over the past 1 year. He is still taking Methimazole and metoprolol and lisinopril without problems. He has gained about 15 Lbs. He has occasional palpitations lasting 20-30 seconds. He also c/o dyspnea on exertion over the past couple months. Mild ankle edema present as well.  Past Medical History:  Diagnosis Date  . Acute diastolic CHF (congestive heart failure) (HCC)   . CHF (congestive heart failure) (HCC) 03/21/2016  . Fluid overload 03/22/2016  . LV dysfunction 03/23/2016  . Thyroid disease     Past Surgical History:  Procedure Laterality Date  . steel face plate     3086, after football facial injury from collision    Social History   Socioeconomic History  . Marital status: Married    Spouse name: Not on file  . Number of children: 3  . Years of education: Not on file  . Highest education  level: Not on file  Occupational History  . Not on file  Social Needs  . Financial resource strain: Not on file  . Food insecurity:    Worry: Not on file    Inability: Not on file  . Transportation needs:    Medical: Not on file    Non-medical: Not on file  Tobacco Use  . Smoking status: Never Smoker  . Smokeless tobacco: Never Used  Substance and Sexual Activity  . Alcohol use: No  . Drug use: No  . Sexual activity: Not on file  Lifestyle  . Physical activity:    Days per week: Not on file    Minutes per session: Not on file  . Stress: Not on file  Relationships  . Social connections:    Talks on phone: Not on file    Gets together: Not on file    Attends religious service: Not on file    Active member of club or organization: Not on file    Attends meetings of clubs or organizations: Not on file    Relationship status: Not on file  . Intimate partner violence:    Fear of current or ex partner: Not on file    Emotionally abused: Not on file    Physically abused: Not on file    Forced sexual activity: Not on file  Other Topics Concern  . Not on file  Social History Narrative  . Not on  file    Current Outpatient Medications on File Prior to Visit  Medication Sig Dispense Refill  . ibuprofen (ADVIL,MOTRIN) 200 MG tablet Take 200 mg by mouth every 6 (six) hours as needed for headache.    . methimazole (TAPAZOLE) 10 MG tablet Take 1 tablet (10 mg total) by mouth daily. 30 tablet 2  . metoprolol (LOPRESSOR) 100 MG tablet Take 1 tablet (100 mg total) by mouth 2 (two) times daily. 60 tablet 0  . ramipril (ALTACE) 2.5 MG capsule Take 1 capsule (2.5 mg total) by mouth daily. 30 capsule 2   No current facility-administered medications on file prior to visit.     Review of Systems  Constitution: Positive for weight gain (15 lbs over few months). Negative for chills, decreased appetite and malaise/fatigue.  Cardiovascular: Positive for dyspnea on exertion, leg swelling (mild  ankle and leg edema after standing) and palpitations (short lasting). Negative for chest pain and syncope.  Endocrine: Negative for cold intolerance.  Hematologic/Lymphatic: Does not bruise/bleed easily.  Musculoskeletal: Negative for joint swelling.  Gastrointestinal: Negative for abdominal pain, anorexia and change in bowel habit.  Neurological: Negative for headaches and light-headedness.  Psychiatric/Behavioral: Negative for depression and substance abuse.  All other systems reviewed and are negative.     Objective  Blood pressure (!) 154/86, height 6\' 2"  (1.88 m), weight 265 lb (120.2 kg). Body mass index is 34.02 kg/m. Physical exam not performed or limited due to virtual visit. Please see exam details from prior visit is as below. Patient did not appear to be in acute distress.  Mild obesity noted.  He does have mild leg edema and also mild discoloration of the legs related to chronic venostasis, no varicose veins where evident.  Otherwise breathing was nonlabored.  Please see below.    Physical Exam  Constitutional: He appears well-developed. No distress.  Mildly obese  HENT:  Head: Atraumatic.  Eyes: Conjunctivae are normal.  Neck: Neck supple. No JVD present. Thyromegaly present.  Cardiovascular: Normal rate, regular rhythm, normal heart sounds and intact distal pulses. Exam reveals no gallop.  No murmur heard. Pulmonary/Chest: Effort normal and breath sounds normal.  Abdominal: Soft. Bowel sounds are normal.  Musculoskeletal: Normal range of motion.        General: No edema.  Neurological: He is alert.  Skin: Skin is warm and dry.  Psychiatric: He has a normal mood and affect.   Radiology: No results found.  Laboratory examination:    CMP Latest Ref Rng & Units 03/26/2016 03/25/2016 03/24/2016  Glucose 65 - 99 mg/dL 78 89 85  BUN 6 - 20 mg/dL 11 12 15   Creatinine 0.61 - 1.24 mg/dL 0.10 2.72 5.36  Sodium 135 - 145 mmol/L 139 142 141  Potassium 3.5 - 5.1 mmol/L 4.3 4.3  3.6  Chloride 101 - 111 mmol/L 102 105 104  CO2 22 - 32 mmol/L 27 27 27   Calcium 8.9 - 10.3 mg/dL 6.4(Q) 0.3(K) 8.3(L)  Total Protein 6.5 - 8.1 g/dL - - 6.5  Total Bilirubin 0.3 - 1.2 mg/dL - - 3.1(H)  Alkaline Phos 38 - 126 U/L - - 174(H)  AST 15 - 41 U/L - - 29  ALT 17 - 63 U/L - - 28   CBC Latest Ref Rng & Units 03/25/2016 03/24/2016 03/23/2016  WBC 4.0 - 10.5 K/uL 6.6 7.0 8.8  Hemoglobin 13.0 - 17.0 g/dL 11.9(L) 11.5(L) 11.8(L)  Hematocrit 39.0 - 52.0 % 36.3(L) 34.6(L) 35.0(L)  Platelets 150 - 400 K/uL 173 165  178   Lipid Panel  No results found for: CHOL, TRIG, HDL, CHOLHDL, VLDL, LDLCALC, LDLDIRECT HEMOGLOBIN A1C No results found for: HGBA1C, MPG TSH No results for input(s): TSH in the last 8760 hours.  Cardiac Studies:    Echocardiogram 10/04/2016: Left ventricle cavity is normal in size. Mild to moderate concentric hypertrophy of the left ventricle. Borderline global hypokinesis with low normal LVEF. Wall motion with reduced sensitivity due to poor apical views.  Unable to evaluate diastolic function due to A. Fibrillation. Calculated EF 54%. Left atrial cavity is mildly dilated at 4.2 cm. Trileaflet aortic valve with trace regurgitation. Mild to at most mild to moderate mitral regurgitation. Mild tricuspid regurgitation. Mild pulmonary hypertension. Pulmonary artery systolic pressure is estimated at 30 mm Hg.  Assessment   Benign essential HTN - Plan: triamterene-hydrochlorothiazide (MAXZIDE-25) 37.5-25 MG tablet, Comprehensive metabolic panel  History of atrial fibrillation in the setting of hyperthyroidism 03/21/16  CHA2DS2-VASc Score is 1 with yearly risk of stroke of 0.6%. - Plan: PCV ECHOCARDIOGRAM COMPLETE  Dyspnea on exertion - Plan: PCV ECHOCARDIOGRAM COMPLETE  Hyperthyroidism - Plan: Lipid Profile, Thyroid Panel With TSH  EKG 08/10/2018: Sinus rhythm 63 bpm. Normal axis. Normal conduction. Normal EKG.  Recommendations:   Patient with thyroid crisis due to  hyperthyroidism, in 2017 had developed acute decompensated heart failure which resolved on therapy.   No anticoagulation is indicated with regard to atrial fibrillation after is no recurrence.   Patient is now complaining of new onset dyspnea and also mild leg edema.  I'll start him on Maxide 37.5/25 mg every morning and we'll obtain a CMP along with lipid profile testing and thyroid panel, patient has not seen Dr. Mikeal HawthorneGarba in greater than a year.  Advised him that he should make an appointment at the soonest. In view of new onset dyspnea and leg edema, I will repeat echocardiogram and would like to see him back in the office in one month.  He'll contact me if symptoms are getting worse.  Yates DecampJay Zia Kanner, MD, [facility] HospitalFACC 03/12/2019, 3:22 PM [facility] Pager: [phone] Office: [phone] If no answer Cell [phone]

## 2019-03-25 DIAGNOSIS — E0590 Thyrotoxicosis, unspecified without thyrotoxic crisis or storm: Secondary | ICD-10-CM | POA: Diagnosis not present

## 2019-03-25 DIAGNOSIS — I10 Essential (primary) hypertension: Secondary | ICD-10-CM | POA: Diagnosis not present

## 2019-03-26 LAB — COMPREHENSIVE METABOLIC PANEL
ALT: 12 IU/L (ref 0–44)
AST: 11 IU/L (ref 0–40)
Albumin/Globulin Ratio: 1.6 (ref 1.2–2.2)
Albumin: 4.1 g/dL (ref 4.0–5.0)
Alkaline Phosphatase: 95 IU/L (ref 39–117)
BUN/Creatinine Ratio: 17 (ref 9–20)
BUN: 14 mg/dL (ref 6–24)
Bilirubin Total: 1.5 mg/dL — ABNORMAL HIGH (ref 0.0–1.2)
CO2: 23 mmol/L (ref 20–29)
Calcium: 9 mg/dL (ref 8.7–10.2)
Chloride: 106 mmol/L (ref 96–106)
Creatinine, Ser: 0.84 mg/dL (ref 0.76–1.27)
GFR calc Af Amer: 123 mL/min/{1.73_m2} (ref >59)
GFR calc non Af Amer: 106 mL/min/{1.73_m2} (ref >59)
Globulin, Total: 2.5 g/dL (ref 1.5–4.5)
Glucose: 99 mg/dL (ref 65–99)
Potassium: 4.4 mmol/L (ref 3.5–5.2)
Sodium: 142 mmol/L (ref 134–144)
Total Protein: 6.6 g/dL (ref 6.0–8.5)

## 2019-03-26 LAB — LIPID PANEL
Chol/HDL Ratio: 2.5 ratio (ref 0.0–5.0)
Cholesterol, Total: 130 mg/dL (ref 100–199)
HDL: 53 mg/dL (ref >39)
LDL Calculated: 64 mg/dL (ref 0–99)
Triglycerides: 64 mg/dL (ref 0–149)
VLDL Cholesterol Cal: 13 mg/dL (ref 5–40)

## 2019-03-26 LAB — THYROID PANEL WITH TSH
Free Thyroxine Index: 2.3 (ref 1.2–4.9)
T3 Uptake Ratio: 27 % (ref 24–39)
T4, Total: 8.5 ug/dL (ref 4.5–12.0)
TSH: <0.006 u[IU]/mL — ABNORMAL LOW (ref 0.450–4.500)

## 2019-03-28 ENCOUNTER — Other Ambulatory Visit: Payer: BLUE CROSS/BLUE SHIELD

## 2019-04-04 ENCOUNTER — Other Ambulatory Visit: Payer: BLUE CROSS/BLUE SHIELD

## 2019-04-12 ENCOUNTER — Ambulatory Visit: Payer: BLUE CROSS/BLUE SHIELD | Admitting: Cardiology

## 2019-04-12 ENCOUNTER — Other Ambulatory Visit: Payer: Self-pay

## 2019-04-12 ENCOUNTER — Ambulatory Visit (INDEPENDENT_AMBULATORY_CARE_PROVIDER_SITE_OTHER): Payer: BLUE CROSS/BLUE SHIELD

## 2019-04-12 DIAGNOSIS — R0600 Dyspnea, unspecified: Secondary | ICD-10-CM

## 2019-04-12 DIAGNOSIS — R0609 Other forms of dyspnea: Secondary | ICD-10-CM | POA: Diagnosis not present

## 2019-04-12 DIAGNOSIS — Z8679 Personal history of other diseases of the circulatory system: Secondary | ICD-10-CM | POA: Diagnosis not present

## 2019-04-19 ENCOUNTER — Encounter: Payer: Self-pay | Admitting: Cardiology

## 2019-04-19 ENCOUNTER — Other Ambulatory Visit: Payer: Self-pay

## 2019-04-19 ENCOUNTER — Ambulatory Visit: Payer: BLUE CROSS/BLUE SHIELD | Admitting: Cardiology

## 2019-04-19 VITALS — Ht 74.0 in | Wt 265.0 lb

## 2019-04-19 DIAGNOSIS — I10 Essential (primary) hypertension: Secondary | ICD-10-CM

## 2019-04-19 DIAGNOSIS — I517 Cardiomegaly: Secondary | ICD-10-CM

## 2019-04-19 DIAGNOSIS — Z8679 Personal history of other diseases of the circulatory system: Secondary | ICD-10-CM | POA: Diagnosis not present

## 2019-04-19 MED ORDER — TRIAMTERENE-HCTZ 37.5-25 MG PO TABS: 1.0000 | ORAL_TABLET | ORAL | 2 refills | Status: DC

## 2019-04-19 NOTE — Progress Notes (Signed)
Virtual Visit via Telephone Note: Patient unable to use video assisted device.  This visit type was conducted due to national recommendations for restrictions regarding the COVID-19 Pandemic (e.g. social distancing).  This format is felt to be most appropriate for this patient at this time.  All issues noted in this document were discussed and addressed.  No physical exam was performed.  The patient has consented to conduct a Telehealth visit and understands insurance will be billed.   I connected with@, on 04/19/19 at  by TELEPHONE and verified that I am speaking with the correct person using two identifiers.   I discussed the limitations of evaluation and management by telemedicine and the availability of in person appointments. The patient expressed understanding and agreed to proceed.   I have discussed with patient regarding the safety during COVID Pandemic and steps and precautions to be taken including social distancing, frequent hand wash and use of detergent soap, gels with the patient. I asked the patient to avoid touching mouth, nose, eyes, ears with the hands. I encouraged regular walking around the neighborhood and exercise and regular diet, as long as social distancing can be maintained.  Primary Physician/Referring:  Troy Morris, Troy L, MD  Patient ID: Troy Morris Levings, male    DOB: [birth year], 43 y.o.   MRN: [id number]  Chief Complaint  Patient presents with  . Hypertension  . Follow-up    4WK    HPI: Troy Morris  is a 43 y.o. male  with hyperthyroidism, previously on methimazole and atenolol, however had ran out of his medications for one month and presented to the hospital on 03/21/2016 with tachycardia, coughing, and shortness of breath. Radiation therapy has been recommended in the past, however he has never proceeded with this option due to financial concerns. On admission, he was noted to be in A. fib with RVR with accelerated hypertension. Echocardiogram during  hospitalization revealed markedly reduced LVEF at 20%.   After restarting thyroid therapy, and treatment of hypertension, EF normalized by echo in Sept 2019 and since then has not had any CHF episodes. There has not been recurrence of atrial fibrillation and he is not on anticoagulation due to low cardioembolic risk.    Patient was last seen 4 weeks ago for annual follow-up and reported new onset dyspnea on exertion, leg swelling, and weight gain. He has occasional palpitations lasting 20-30 seconds.  He was started on Maxide and underwent echocardiogram on 04/03/19 and presents for f/u.  Echo showed showed mild left ventricle dilation, normal LVEF.  No significant changes compared to 2017.  Presently doing well. No chest pain and dyspnea is stable. No palpitations.   Past Medical History:  Diagnosis Date  . Acute diastolic CHF (congestive heart failure) (HCC)   . CHF (congestive heart failure) (HCC) 03/21/2016  . Fluid overload 03/22/2016  . LV dysfunction 03/23/2016  . Thyroid disease     Past Surgical History:  Procedure Laterality Date  . steel face plate     [id number], after football facial injury from collision    Social History   Socioeconomic History  . Marital status: Married    Spouse name: Not on file  . Number of children: 3  . Years of education: Not on file  . Highest education level: Not on file  Occupational History  . Not on file  Social Needs  . Financial resource strain: Not on file  . Food insecurity:    Worry: Not on file    Inability:  Not on file  . Transportation needs:    Medical: Not on file    Non-medical: Not on file  Tobacco Use  . Smoking status: Never Smoker  . Smokeless tobacco: Never Used  Substance and Sexual Activity  . Alcohol use: No  . Drug use: No  . Sexual activity: Not on file  Lifestyle  . Physical activity:    Days per week: Not on file    Minutes per session: Not on file  . Stress: Not on file  Relationships  . Social connections:     Talks on phone: Not on file    Gets together: Not on file    Attends religious service: Not on file    Active member of club or organization: Not on file    Attends meetings of clubs or organizations: Not on file    Relationship status: Not on file  . Intimate partner violence:    Fear of current or ex partner: Not on file    Emotionally abused: Not on file    Physically abused: Not on file    Forced sexual activity: Not on file  Other Topics Concern  . Not on file  Social History Narrative  . Not on file    Current Outpatient Medications on File Prior to Visit  Medication Sig Dispense Refill  . ibuprofen (ADVIL,MOTRIN) 200 MG tablet Take 200 mg by mouth every 6 (six) hours as needed for headache.    . methimazole (TAPAZOLE) 10 MG tablet Take 1 tablet (10 mg total) by mouth daily. 30 tablet 2  . metoprolol (LOPRESSOR) 100 MG tablet Take 1 tablet (100 mg total) by mouth 2 (two) times daily. (Patient taking differently: Take 200 mg by mouth daily. ) 60 tablet 0  . ramipril (ALTACE) 2.5 MG capsule Take 1 capsule (2.5 mg total) by mouth daily. 30 capsule 2   No current facility-administered medications on file prior to visit.    Review of Systems  Constitution: Positive for weight gain (15 lbs over few months). Negative for chills, decreased appetite and malaise/fatigue.  Cardiovascular: Positive for dyspnea on exertion, leg swelling (mild ankle and leg edema after standing) and palpitations (short lasting). Negative for chest pain and syncope.  Endocrine: Negative for cold intolerance.  Hematologic/Lymphatic: Does not bruise/bleed easily.  Musculoskeletal: Negative for joint swelling.  Gastrointestinal: Negative for abdominal pain, anorexia and change in bowel habit.  Neurological: Negative for headaches and light-headedness.  Psychiatric/Behavioral: Negative for depression and substance abuse.  All other systems reviewed and are negative.     Objective  Height  (1.88 m),  weight 265 lb (120.2 kg). Body mass index is 34.02 kg/m. Physical exam not performed or limited due to virtual visit. Please see exam details from prior visit is as below. Patient did not appear to be in acute distress.  Mild obesity noted.  He does have mild leg edema and also mild discoloration of the legs related to chronic venostasis, no varicose veins where evident.  Otherwise breathing was nonlabored.  Please see below.    Physical Exam  Constitutional: He appears well-developed. No distress.  Mildly obese  HENT:  Head: Atraumatic.  Eyes: Conjunctivae are normal.  Neck: Neck supple. No JVD present. Thyromegaly present.  Cardiovascular: Normal rate, regular rhythm, normal heart sounds and intact distal pulses. Exam reveals no gallop.  No murmur heard. Pulmonary/Chest: Effort normal and breath sounds normal.  Abdominal: Soft. Bowel sounds are normal.  Musculoskeletal: Normal range of motion.  General: No edema.  Neurological: He is alert.  Skin: Skin is warm and dry.  Psychiatric: He has a normal mood and affect.   Radiology: No results found.  Laboratory examination:   CMP Latest Ref Rng & Units 03/25/2019 03/26/2016 03/25/2016  Glucose 65 - 99 mg/dL 99 78 89  BUN 6 - 24 mg/dL 14 11 12   Creatinine 0.76 - 1.27 mg/dL 0.35 5.97 4.16  Sodium 134 - 144 mmol/Morris 142 139 142  Potassium 3.5 - 5.2 mmol/Morris 4.4 4.3 4.3  Chloride 96 - 106 mmol/Morris 106 102 105  CO2 20 - 29 mmol/Morris 23 27 27   Calcium 8.7 - 10.2 mg/dL 9.0 3.8(G) 5.3(M)  Total Protein 6.0 - 8.5 g/dL 6.6 - -  Total Bilirubin 0.0 - 1.2 mg/dL 4.6(O) - -  Alkaline Phos 39 - 117 IU/Morris 95 - -  AST 0 - 40 IU/Morris 11 - -  ALT 0 - 44 IU/Morris 12 - -   CBC Latest Ref Rng & Units 03/25/2016 03/24/2016 03/23/2016  WBC 4.0 - 10.5 K/uL 6.6 7.0 8.8  Hemoglobin 13.0 - 17.0 g/dL 11.9(Morris) 11.5(Morris) 11.8(Morris)  Hematocrit 39.0 - 52.0 % 36.3(Morris) 34.6(Morris) 35.0(Morris)  Platelets 150 - 400 K/uL 173 165 178   Lipid Panel     Component Value Date/Time   CHOL 130  03/25/2019 0842   TRIG 64 03/25/2019 0842   HDL 53 03/25/2019 0842   CHOLHDL 2.5 03/25/2019 0842   LDLCALC 64 03/25/2019 0842   HEMOGLOBIN A1C No results found for: HGBA1C, MPG TSH Recent Labs    03/25/19 0842  TSH <0.006*    Cardiac Studies:   Echocardiogram 04/12/2019: Normal LV systolic function with EF 55%. Left ventricle cavity is mildly dilated. Normal global wall motion. Normal diastolic filling pattern. Calculated EF 55%. Left atrial cavity is mildly dilated. Mild (Grade I) mitral regurgitation. Mild to moderate tricuspid regurgitation. Estimated pulmonary artery systolic pressure 31 mmHg. RVSP measures 31 mmHg. No significant change compared to previous study in 2017.  Assessment   Cardiomegaly  History of atrial fibrillation in the setting of hyperthyroidism 03/21/16  Benign essential HTN - Plan: triamterene-hydrochlorothiazide (MAXZIDE-25) 37.5-25 MG tablet  EKG 08/10/2018: Sinus rhythm 63 bpm. Normal axis. Normal conduction. Normal EKG.  Recommendations:   Patient with thyroid crisis due to hyperthyroidism, in 2017 had developed acute decompensated heart failure which resolved on therapy.   No anticoagulation is indicated with regard to atrial fibrillation after is no recurrence.   On his last office visit 6 weeks ago, I started him on Maxide 37.5/25 mg, for hypertension which he  has not started.  I reviewed the results of the echocardiogram, essentially revealing mild cardiomegaly but well preserved LVEF.  I have encouraged him to have thyroid ablation in view of his age it would be the best option.  Weight  loss and regular exercise discussed with the patient.  From cardiac standpoint he remained stable, he needs close follow-up of hypertension.  I will be happy to see him back in 6 months if he so chooses.  Otherwise he can follow-up with Dr. Mikeal Hawthorne.  Yates Decamp, MD, [facility] 04/19/2019, 1:20 PM [facility] Pager: [phone] Office: [phone]  If no answer Cell [phone]

## 2019-07-16 ENCOUNTER — Other Ambulatory Visit: Payer: Self-pay | Admitting: Cardiology

## 2019-07-16 DIAGNOSIS — I10 Essential (primary) hypertension: Secondary | ICD-10-CM

## 2019-08-14 ENCOUNTER — Other Ambulatory Visit: Payer: Self-pay | Admitting: Cardiology

## 2019-08-14 DIAGNOSIS — I10 Essential (primary) hypertension: Secondary | ICD-10-CM

## 2019-10-24 DIAGNOSIS — Z20828 Contact with and (suspected) exposure to other viral communicable diseases: Secondary | ICD-10-CM | POA: Diagnosis not present

## 2019-11-25 DIAGNOSIS — R438 Other disturbances of smell and taste: Secondary | ICD-10-CM | POA: Diagnosis not present

## 2019-11-25 DIAGNOSIS — Z20822 Contact with and (suspected) exposure to covid-19: Secondary | ICD-10-CM | POA: Diagnosis not present

## 2020-07-07 ENCOUNTER — Ambulatory Visit: Payer: MEDICAID | Attending: Internal Medicine

## 2020-07-07 DIAGNOSIS — Z23 Encounter for immunization: Secondary | ICD-10-CM

## 2020-07-07 NOTE — Progress Notes (Signed)
   Covid-19 Vaccination Clinic  Name:  Troy Morris    MRN: [id number] DOB: February 15, 1976  07/07/2020  [name] was observed post Covid-19 immunization for 15 minutes without incident. He was provided with Vaccine Information Sheet and instruction to access the V-Safe system.   Mr. Pons was instructed to call 911 with any severe reactions post vaccine: Marland Kitchen Difficulty breathing  . Swelling of face and throat  . A fast heartbeat  . A bad rash all over body  . Dizziness and weakness   Immunizations Administered    Name Date Dose VIS Date Route   Moderna COVID-19 Vaccine 07/07/2020 11:07 AM 0.5 mL 10/2019 Intramuscular   Manufacturer: Moderna   Lot: [id number]   NDC: 45364-680-32

## 2020-07-16 DIAGNOSIS — E0590 Thyrotoxicosis, unspecified without thyrotoxic crisis or storm: Secondary | ICD-10-CM | POA: Diagnosis not present

## 2020-07-16 DIAGNOSIS — I10 Essential (primary) hypertension: Secondary | ICD-10-CM | POA: Diagnosis not present

## 2020-07-16 DIAGNOSIS — Z0000 Encounter for general adult medical examination without abnormal findings: Secondary | ICD-10-CM | POA: Diagnosis not present

## 2020-08-04 ENCOUNTER — Ambulatory Visit: Payer: MEDICAID | Attending: Internal Medicine

## 2020-08-04 DIAGNOSIS — Z23 Encounter for immunization: Secondary | ICD-10-CM

## 2020-08-04 NOTE — Progress Notes (Signed)
   Covid-19 Vaccination Clinic  Name:  Troy Morris    MRN: [id number] DOB: October 18, 1976  08/04/2020  [name] was observed post Covid-19 immunization for 15 minutes without incident. He was provided with Vaccine Information Sheet and instruction to access the V-Safe system.   Mr. Needle was instructed to call 911 with any severe reactions post vaccine: Marland Kitchen Difficulty breathing  . Swelling of face and throat  . A fast heartbeat  . A bad rash all over body  . Dizziness and weakness   Immunizations Administered    Name Date Dose VIS Date Route   Moderna COVID-19 Vaccine 08/04/2020  9:08 AM 0.5 mL 10/2019 Intramuscular   Manufacturer: Moderna   Lot: [id number]   NDC: 12878-676-72

## 2020-08-18 ENCOUNTER — Ambulatory Visit (INDEPENDENT_AMBULATORY_CARE_PROVIDER_SITE_OTHER): Payer: BLUE CROSS/BLUE SHIELD | Admitting: Internal Medicine

## 2020-08-18 ENCOUNTER — Encounter: Payer: Self-pay | Admitting: Internal Medicine

## 2020-08-18 ENCOUNTER — Other Ambulatory Visit: Payer: Self-pay

## 2020-08-18 VITALS — BP 132/78 | HR 57 | Ht 73.0 in | Wt 274.0 lb

## 2020-08-18 DIAGNOSIS — E0590 Thyrotoxicosis, unspecified without thyrotoxic crisis or storm: Secondary | ICD-10-CM | POA: Diagnosis not present

## 2020-08-18 NOTE — Patient Instructions (Signed)
-   Continue methimazole 10 mg daily for now  - Labs today and in 6 weeks

## 2020-08-18 NOTE — Progress Notes (Signed)
Name: Troy Morris  MRN/ DOB: [id number], [birth year]    Age/ Sex: 44 y.o., male    PCP: Antony Contras, MD   Reason for Endocrinology Evaluation: Hyperthyroidism     Date of Initial Endocrinology Evaluation: 08/18/2020     HPI: [name] is a 44 y.o. male with a past medical history of HTN, A.Fib  and CHF. The patient presented for initial endocrinology clinic visit on 08/18/2020 for consultative assistance with his hyperthyroidism.   Pt has been diagnosed with hyperthyroidism in 2007. He has been on Methimazole ever since.   Has been diagnosed with CHF in 2017, follows with Dr. Nadyne Coombes  Denies osteoporosis  Has been diagnosed with A.Fib in 2017/2018   No biotin use  No amiodarone use.     Denies weight loss Denies diarrhea Has occasional palpitations  Has noted neck swelling      Currently on Methimazole 10 mg daily   No FH of thyroid disease  HISTORY:  Past Medical History:  Past Medical History:  Diagnosis Date   Acute diastolic CHF (congestive heart failure) (HCC)    CHF (congestive heart failure) (HCC) 03/21/2016   Fluid overload 03/22/2016   LV dysfunction 03/23/2016   Thyroid disease    Past Surgical History:  Past Surgical History:  Procedure Laterality Date   steel face plate     9030, after football facial injury from collision      Social History:  reports that he has never smoked. He has never used smokeless tobacco. He reports that he does not drink alcohol and does not use drugs.  Family History: family history includes Diabetes in his mother.   HOME MEDICATIONS: Allergies as of 08/18/2020   No Known Allergies     Medication List       Accurate as of August 18, 2020  1:40 PM. If you have any questions, ask your nurse or doctor.        ibuprofen 200 MG tablet Commonly known as: ADVIL Take 200 mg by mouth every 6 (six) hours as needed for headache.   methimazole 10 MG tablet Commonly known as: TAPAZOLE Take 1 tablet  (10 mg total) by mouth daily.   metoprolol tartrate 100 MG tablet Commonly known as: LOPRESSOR Take 1 tablet (100 mg total) by mouth 2 (two) times daily. What changed:   how much to take  when to take this   ramipril 2.5 MG capsule Commonly known as: ALTACE Take 1 capsule (2.5 mg total) by mouth daily.   triamterene-hydrochlorothiazide 37.5-25 MG tablet Commonly known as: MAXZIDE-25 TAKE 1 TABLET BY MOUTH ONCE DAILY IN THE MORNING         REVIEW OF SYSTEMS: A comprehensive ROS was conducted with the patient and is negative except as per HPI      OBJECTIVE:  VS: BP 132/78    Pulse (!) 57    Ht '6\' 1"'  (1.854 m)    Wt 274 lb (124.3 kg)    SpO2 97%    BMI 36.15 kg/m    Wt Readings from Last 3 Encounters:  08/18/20 274 lb (124.3 kg)  04/19/19 265 lb (120.2 kg)  03/11/19 265 lb (120.2 kg)     EXAM: General: Pt appears well and is in NAD  Neck: General: Supple without adenopathy. Thyroid: Thyroid size normal.  Thyroid enlarged ~ 60 grams with isthmic nodule.  Lungs: Clear with good BS bilat with no rales, rhonchi, or wheezes  Heart: Auscultation: RRR.  Abdomen: Normoactive bowel sounds, soft, nontender, without masses or organomegaly palpable  Extremities:  BL LE: Trace pretibial edema  Skin: Hair: Texture and amount normal with gender appropriate distribution Skin Inspection: No rashes Skin Palpation: Skin temperature, texture, and thickness normal to palpation  Neuro: Cranial nerves: II - XII grossly intact  Motor: Normal strength throughout DTRs: 2+ and symmetric in UE without delay in relaxation phase  Mental Status: Judgment, insight: Intact Orientation: Oriented to time, place, and person Mood and affect: No depression, anxiety, or agitation     DATA REVIEWED: 07/16/2020 WBC 6.0  H/H 15.1/45. Plt 232 Neut 3.5  GFR 86 Alk Phos 76 AST 14 ALT 11   TSH <0.1 uIU/mL    Results for TRESTON, COKER (MRN [id number]) as of 08/20/2020 07:50  Ref. Range  08/18/2020 13:53  TSH Latest Ref Range: 0.40 - 4.50 mIU/L <0.01 (L)  T4,Free(Direct) Latest Ref Range: 0.8 - 1.8 ng/dL 1.1    ASSESSMENT/PLAN/RECOMMENDATIONS:   1. Hyperthyroidism :  - Diagnosed since 2007, has been on thionamide therapy since.  - Discussed D/D of graves' disease vs toxic thyroid nodule(s)  - We discussed that Graves' Disease is a result of an autoimmune condition involving the thyroid.    - We discussed with pt the benefits of methimazole in the Tx of hyperthyroidism, as well as the possible side effects/complications of anti-thyroid drug Tx (specifically detailing the rare, but serious side effect of agranulocytosis). He was informed of need for regular thyroid function monitoring while on methimazole to ensure appropriate dosage without over-treatment. As well, we discussed the possible side effects of methimazole including the chance of rash, the small chance of liver irritation/juandice and the <=1 in 300-400 chance of sudden onset agranulocytosis.  We discussed importance of going to ED promptly (and stopping methimazole) if hewere to develop significant fever with severe sore throat of other evidence of acute infection.    .  - We extensively discussed the various treatment options for hyperthyroidism and Graves disease including ablation therapy with radioactive iodine versus antithyroid drug treatment versus surgical therapy.  We recommended to the patient that we felt, at this time, that thionamide therapy would be most optimal.  We discussed the various possible benefits versus side effects of the various therapies.   I carefully explained to the patient that one of the consequences of I-131 ablation treatment would likely be permanent hypothyroidism which would require long-term replacement therapy with LT4.     Medications :  Increase Methimazole 10 mg, to one and a half tablet daily    F/U in 3 months  Labs in 6 weeks    Addendum: Left a message on voice  mail on 08/19/2020 Signed electronically by: Mack Guise, MD  [facility]  [address] [address] [place], [place] [address] Phone: [phone] FAX: [phone]   CC: Antony Contras, [place] [address] Phone: [phone] Fax: [phone]   Return to Endocrinology clinic as below: No future appointments.

## 2020-08-19 MED ORDER — METHIMAZOLE 10 MG PO TABS: 15.0000 mg | ORAL_TABLET | Freq: Every day | ORAL | 6 refills | Status: DC

## 2020-08-20 LAB — CBC WITH DIFFERENTIAL/PLATELET
Absolute Monocytes: 773 cells/uL (ref 200–950)
Basophils Absolute: 30 cells/uL (ref 0–200)
Basophils Relative: 0.4 %
Eosinophils Absolute: 90 cells/uL (ref 15–500)
Eosinophils Relative: 1.2 %
HCT: 44.7 % (ref 38.5–50.0)
Hemoglobin: 14.6 g/dL (ref 13.2–17.1)
Lymphs Abs: 2040 cells/uL (ref 850–3900)
MCH: 27 pg (ref 27.0–33.0)
MCHC: 32.7 g/dL (ref 32.0–36.0)
MCV: 82.8 fL (ref 80.0–100.0)
MPV: 10.4 fL (ref 7.5–12.5)
Monocytes Relative: 10.3 %
Neutro Abs: 4568 cells/uL (ref 1500–7800)
Neutrophils Relative %: 60.9 %
Platelets: 239 10*3/uL (ref 140–400)
RBC: 5.4 10*6/uL (ref 4.20–5.80)
RDW: 14 % (ref 11.0–15.0)
Total Lymphocyte: 27.2 %
WBC: 7.5 10*3/uL (ref 3.8–10.8)

## 2020-08-20 LAB — COMPREHENSIVE METABOLIC PANEL
AG Ratio: 1.3 (calc) (ref 1.0–2.5)
ALT: 10 U/L (ref 9–46)
AST: 11 U/L (ref 10–40)
Albumin: 3.9 g/dL (ref 3.6–5.1)
Alkaline phosphatase (APISO): 77 U/L (ref 36–130)
BUN: 11 mg/dL (ref 7–25)
CO2: 26 mmol/L (ref 20–32)
Calcium: 9 mg/dL (ref 8.6–10.3)
Chloride: 102 mmol/L (ref 98–110)
Creat: 0.99 mg/dL (ref 0.60–1.35)
Globulin: 2.9 g/dL (calc) (ref 1.9–3.7)
Glucose, Bld: 79 mg/dL (ref 65–99)
Potassium: 4.2 mmol/L (ref 3.5–5.3)
Sodium: 138 mmol/L (ref 135–146)
Total Bilirubin: 1.5 mg/dL — ABNORMAL HIGH (ref 0.2–1.2)
Total Protein: 6.8 g/dL (ref 6.1–8.1)

## 2020-08-20 LAB — TSH: TSH: <0.01 mIU/L — ABNORMAL LOW (ref 0.40–4.50)

## 2020-08-20 LAB — T4, FREE: Free T4: 1.1 ng/dL (ref 0.8–1.8)

## 2020-08-20 LAB — TRAB (TSH RECEPTOR BINDING ANTIBODY): TRAB: 9.28 IU/L — ABNORMAL HIGH (ref <=2.00)

## 2020-08-21 ENCOUNTER — Encounter: Payer: Self-pay | Admitting: Internal Medicine

## 2020-08-21 ENCOUNTER — Telehealth: Payer: Self-pay | Admitting: Internal Medicine

## 2020-08-21 NOTE — Telephone Encounter (Signed)
Letter sent as another attempt of calling was unsuccessful

## 2020-09-18 ENCOUNTER — Ambulatory Visit (HOSPITAL_BASED_OUTPATIENT_CLINIC_OR_DEPARTMENT_OTHER)
Admission: RE | Admit: 2020-09-18 | Discharge: 2020-09-18 | Disposition: A | Discharge status: Home / Self Care | Payer: BLUE CROSS/BLUE SHIELD | Source: Ambulatory Visit | Attending: Internal Medicine | Admitting: Internal Medicine

## 2020-09-18 ENCOUNTER — Other Ambulatory Visit: Payer: Self-pay

## 2020-09-18 DIAGNOSIS — E010 Iodine-deficiency related diffuse (endemic) goiter: Secondary | ICD-10-CM | POA: Diagnosis not present

## 2020-09-18 DIAGNOSIS — E0590 Thyrotoxicosis, unspecified without thyrotoxic crisis or storm: Secondary | ICD-10-CM | POA: Diagnosis not present

## 2020-09-21 ENCOUNTER — Encounter: Payer: Self-pay | Admitting: Internal Medicine

## 2020-09-27 DIAGNOSIS — Z20822 Contact with and (suspected) exposure to covid-19: Secondary | ICD-10-CM | POA: Diagnosis not present

## 2020-11-05 DIAGNOSIS — R0683 Snoring: Secondary | ICD-10-CM | POA: Diagnosis not present

## 2020-11-05 DIAGNOSIS — R430 Anosmia: Secondary | ICD-10-CM | POA: Diagnosis not present

## 2020-11-05 DIAGNOSIS — Z6835 Body mass index (BMI) 35.0-35.9, adult: Secondary | ICD-10-CM | POA: Diagnosis not present

## 2020-11-24 ENCOUNTER — Encounter: Payer: Self-pay | Admitting: Internal Medicine

## 2020-11-24 ENCOUNTER — Other Ambulatory Visit: Payer: Self-pay

## 2020-11-24 ENCOUNTER — Ambulatory Visit (INDEPENDENT_AMBULATORY_CARE_PROVIDER_SITE_OTHER): Payer: BLUE CROSS/BLUE SHIELD | Admitting: Internal Medicine

## 2020-11-24 VITALS — BP 130/76 | HR 58 | Ht 73.0 in | Wt 277.5 lb

## 2020-11-24 DIAGNOSIS — E0590 Thyrotoxicosis, unspecified without thyrotoxic crisis or storm: Secondary | ICD-10-CM

## 2020-11-24 NOTE — Progress Notes (Signed)
Name: Troy Morris  MRN/ DOB: [id number], 1976-06-11    Age/ Sex: 45 y.o., male     PCP: Tally Joe, MD   Reason for Endocrinology Evaluation: Hyperthyroidism     Initial Endocrinology Clinic Visit: 08/18/2020    PATIENT IDENTIFIER: [name] is a 45 y.o., male with a past medical history of HTN, A.Fib  and CHF. He has followed with [facility] since 08/18/2020 for consultative assistance with management of his hyperthyroidism   HISTORICAL SUMMARY:   Pt has been diagnosed with hyperthyroidism in 2007. He has been on Methimazole ever since.  TRAb was elevated 08/2020 at 9.28 IU/L.   Has also has been diagnosed with CHF in 2017, and A. Fib in ~ 2017/2018 , follows with Dr. Nadara Eaton  Denies osteoporosis   Tyroid ULtrasound showed no evidence of nodules (09/2020)   No FH of thyroid disease SUBJECTIVE:    Today (11/24/2020):  Troy Morris is here for a follow up on hyperthyroidism.   Weight has been stable  Denies fever or abdominal pain  Denies palpitations     Methimazole 10 mg , 1.5 tabs daily BUT he has been taking 1 tablet daily     HISTORY:  Past Medical History:  Past Medical History:  Diagnosis Date   Acute diastolic CHF (congestive heart failure) (HCC)    CHF (congestive heart failure) (HCC) 03/21/2016   Fluid overload 03/22/2016   LV dysfunction 03/23/2016   Thyroid disease    Past Surgical History:  Past Surgical History:  Procedure Laterality Date   steel face plate     5830, after football facial injury from collision    Social History:  reports that he has never smoked. He has never used smokeless tobacco. He reports that he does not drink alcohol and does not use drugs. Family History:  Family History  Problem Relation Age of Onset   Diabetes Mother    CAD Neg Hx    Stroke Neg Hx    Cancer Neg Hx    Clotting disorder Neg Hx      HOME MEDICATIONS: Allergies as of 11/24/2020   No Known Allergies      Medication List       Accurate as of November 24, 2020  1:16 PM. If you have any questions, ask your nurse or doctor.        ibuprofen 200 MG tablet Commonly known as: ADVIL Take 200 mg by mouth every 6 (six) hours as needed for headache.   methimazole 10 MG tablet Commonly known as: TAPAZOLE Take 1.5 tablets (15 mg total) by mouth daily.   metoprolol tartrate 100 MG tablet Commonly known as: LOPRESSOR Take 1 tablet (100 mg total) by mouth 2 (two) times daily. What changed:   how much to take  when to take this   ramipril 2.5 MG capsule Commonly known as: ALTACE Take 1 capsule (2.5 mg total) by mouth daily.   triamterene-hydrochlorothiazide 37.5-25 MG tablet Commonly known as: MAXZIDE-25 TAKE 1 TABLET BY MOUTH ONCE DAILY IN THE MORNING         OBJECTIVE:   PHYSICAL EXAM: VS: BP 130/76    Pulse (!) 58    Ht 6\' 1"  (1.854 m)    Wt 277 lb 8 oz (125.9 kg)    SpO2 98%    BMI 36.61 kg/m    EXAM: General: Pt appears well and is in NAD  Eyes: External eye exam normal with a stare,but no  lid  lag or exophthalmos.  EOM intact.    Neck: General: Supple without adenopathy. Thyroid: Thyroid size normal.  Isthmic 1.5 cm nodule palpated   Lungs: Clear with good BS bilat with no rales, rhonchi, or wheezes  Heart: Auscultation: RRR.  Abdomen: Normoactive bowel sounds, soft, nontender, without masses or organomegaly palpable  Extremities:  BL LE: No pretibial edema normal ROM and strength.  Mental Status: Judgment, insight: Intact Mood and affect: No depression, anxiety, or agitation     DATA REVIEWED: Results for Troy Morris, Troy Morris (MRN [id number]) as of 11/25/2020 11:46  Ref. Range 11/24/2020 13:28  TSH Latest Ref Range: 0.35 - 4.50 uIU/mL 0.38  T4,Free(Direct) Latest Ref Range: 0.60 - 1.60 ng/dL 3.97    Results for Troy Morris, Troy Morris (MRN [id number]) as of 11/24/2020 12:44  Ref. Range 08/18/2020 13:53  TRAB Latest Ref Range: <=2.00 IU/L 9.28 (H)  Estimated total number of  nodules >/= 1 cm: 0  Number of spongiform nodules >/=  2 cm not described below (TR1): 0  Number of mixed cystic and solid nodules >/= 1.5 cm not described below (TR2): 0  _________________________________________________________  Similar marked heterogeneity and thyroid enlargement. No hypervascularity or focal abnormality. No new or enlarging nodule. No bulky adenopathy.  IMPRESSION: Stable heterogeneous enlarged thyroid compatible with medical thyroid disease.  ASSESSMENT / PLAN / RECOMMENDATIONS:   1. Hyperthyroidism Secondary to Graves' Disease :  - Pt is clinically euthyroid  - We discussed Graves' disease is an immune condition and could affect the eyes  - He has been taking less methimazole then previously prescribed but his TFT's have normalized and will not make any changes  - Despite his ultrasound showing no nodules, on exam he does have an isthmic nodule ~ 1.5 cm , will keep an eye on this.    Medications   Continue Methimazole 10 mg , 1 tablet daily     2. Graves' Disease:   - NO extrathyroidal Manifestations of Graves' disease    F/U in 4 months    2. Graves' Disease:  Signed electronically by: Lyndle Herrlich, MD  [facility] Endocrinology  [facility] [address] [place], [place] [address] Phone: [phone] FAX: [phone]      CC: Tally Joe, MD [address] [place] [address] Phone: [phone]  Fax: [phone]   Return to Endocrinology clinic as below: No future appointments.

## 2020-11-24 NOTE — Patient Instructions (Signed)
-   Please stop by the lab today  

## 2020-11-25 ENCOUNTER — Encounter: Payer: Self-pay | Admitting: Internal Medicine

## 2020-11-25 LAB — TSH: TSH: 0.38 u[IU]/mL (ref 0.35–4.50)

## 2020-11-25 LAB — T4, FREE: Free T4: 0.75 ng/dL (ref 0.60–1.60)

## 2021-03-04 DIAGNOSIS — I10 Essential (primary) hypertension: Secondary | ICD-10-CM | POA: Diagnosis not present

## 2021-03-04 DIAGNOSIS — R001 Bradycardia, unspecified: Secondary | ICD-10-CM | POA: Diagnosis not present

## 2021-04-13 ENCOUNTER — Telehealth: Payer: Self-pay

## 2021-04-14 DIAGNOSIS — R002 Palpitations: Secondary | ICD-10-CM | POA: Diagnosis not present

## 2021-04-15 NOTE — Progress Notes (Deleted)
Primary Physician/Referring:  Tally Joe, MD  Patient ID: Troy Morris, male    DOB: [birth year], 45 y.o.   MRN: [id number]  No chief complaint on file.   HPI: Troy Morris  is a 45 y.o. male  with hyperthyroidism, previously on methimazole and atenolol, however had ran out of his medications for one month and presented to the hospital on 03/21/2016 with tachycardia, coughing, and shortness of breath. Radiation therapy has been recommended in the past, however he has never proceeded with this option due to financial concerns. On admission, he was noted to be in A. fib with RVR with accelerated hypertension. Echocardiogram during hospitalization revealed markedly reduced LVEF at 20%.   After restarting thyroid therapy, and treatment of hypertension, EF normalized by echo in Sept 2019 and since then has not had any CHF episodes. There has not been recurrence of atrial fibrillation and he is not on anticoagulation due to low cardioembolic risk.    ***Patient was last seen by Dr. Jacinto Halim 04/19/2019 via telemedicine at which time he was stable from a cardiovascular standpoint and recommended for as needed follow-up.  Patient now presents for urgent visit with concerns of ***  ***  Patient was last seen 4 weeks ago for annual follow-up and reported new onset dyspnea on exertion, leg swelling, and weight gain. He has occasional palpitations lasting 20-30 seconds.  He was started on Maxide and underwent echocardiogram on 04/03/19 and presents for f/u.  Echo showed showed mild left ventricle dilation, normal LVEF.  No significant changes compared to 2017.  Presently doing well. No chest pain and dyspnea is stable. No palpitations.   Past Medical History:  Diagnosis Date  . Acute diastolic CHF (congestive heart failure) (HCC)   . CHF (congestive heart failure) (HCC) 03/21/2016  . Fluid overload 03/22/2016  . LV dysfunction 03/23/2016  . Thyroid disease     Past Surgical History:  Procedure  Laterality Date  . steel face plate     9371, after football facial injury from collision   Family History  Problem Relation Age of Onset  . Diabetes Mother   . CAD Neg Hx   . Stroke Neg Hx   . Cancer Neg Hx   . Clotting disorder Neg Hx    Social History   Tobacco Use  . Smoking status: Never Smoker  . Smokeless tobacco: Never Used  Substance Use Topics  . Alcohol use: No   Marital Status: Married   ROS  Review of Systems  Constitutional: Positive for weight gain (15 lbs over few months). Negative for chills, decreased appetite and malaise/fatigue.  Cardiovascular: Positive for dyspnea on exertion, leg swelling (mild ankle and leg edema after standing) and palpitations (short lasting). Negative for chest pain and syncope.  Endocrine: Negative for cold intolerance.  Hematologic/Lymphatic: Does not bruise/bleed easily.  Musculoskeletal: Negative for joint swelling.  Gastrointestinal: Negative for abdominal pain, anorexia and change in bowel habit.  Neurological: Negative for headaches and light-headedness.  Psychiatric/Behavioral: Negative for depression and substance abuse.  All other systems reviewed and are negative.     Objective  There were no vitals taken for this visit. There is no height or weight on file to calculate BMI.   Physical Exam Constitutional:      General: He is not in acute distress.    Appearance: He is well-developed.     Comments: Mildly obese  HENT:     Head: Atraumatic.  Eyes:     Conjunctiva/sclera: Conjunctivae normal.  Neck:     Thyroid: Thyromegaly present.     Vascular: No JVD.  Cardiovascular:     Rate and Rhythm: Normal rate and regular rhythm.     Pulses: Intact distal pulses.     Heart sounds: Normal heart sounds. No murmur heard. No gallop.   Pulmonary:     Effort: Pulmonary effort is normal.     Breath sounds: Normal breath sounds.  Abdominal:     General: Bowel sounds are normal.     Palpations: Abdomen is soft.   Musculoskeletal:        General: Normal range of motion.     Cervical back: Neck supple.  Skin:    General: Skin is warm and dry.  Neurological:     Mental Status: He is alert.    Laboratory examination:   CMP Latest Ref Rng & Units 08/18/2020 03/25/2019 03/26/2016  Glucose 65 - 99 mg/dL 79 99 78  BUN 7 - 25 mg/dL 11 14 11   Creatinine 0.60 - 1.35 mg/dL 5.05 3.97  Sodium 135 - 146 mmol/L 138 142 139  Potassium 3.5 - 5.3 mmol/L 4.2 4.4 4.3  Chloride 98 - 110 mmol/L 102 106 102  CO2 20 - 32 mmol/L 26 23 27   Calcium 8.6 - 10.3 mg/dL 9.0 9.0 6.73)  Total Protein 6.1 - 8.1 g/dL 6.8 6.6 -  Total Bilirubin 0.2 - 1.2 mg/dL ) 4.1(P) -  Alkaline Phos 39 - 117 IU/L - 95 -  AST 10 - 40 U/L 11 11 -  ALT 9 - 46 U/L 10 12 -   CBC Latest Ref Rng & Units 08/18/2020 03/25/2016 03/24/2016  WBC 3.8 - 10.8 Thousand/uL 7.5 6.6 7.0  Hemoglobin 13.2 - 17.1 g/dL 05/25/2016 11.9(L) 11.5(L)  Hematocrit 38.5 - 50.0 % 44.7 36.3(L) 34.6(L)  Platelets 140 - 400 Thousand/uL 239 173 165   Lipid Panel     Component Value Date/Time   CHOL 130 03/25/2019 0842   TRIG 64 03/25/2019 0842   HDL 53 03/25/2019 0842   CHOLHDL 2.5 03/25/2019 0842   LDLCALC 64 03/25/2019 0842   HEMOGLOBIN A1C No results found for: HGBA1C, MPG TSH Recent Labs    08/18/20 1353 11/24/20 1328  TSH <0.01* 0.38   Allergies  No Known Allergies    Medications Prior to Visit:   Outpatient Medications Prior to Visit  Medication Sig Dispense Refill  . ibuprofen (ADVIL,MOTRIN) 200 MG tablet Take 200 mg by mouth every 6 (six) hours as needed for headache.    . methimazole (TAPAZOLE) 10 MG tablet Take 1.5 tablets (15 mg total) by mouth daily. 45 tablet 6  . metoprolol (LOPRESSOR) 100 MG tablet Take 1 tablet (100 mg total) by mouth 2 (two) times daily. (Patient taking differently: Take 200 mg by mouth daily.) 60 tablet 0  . ramipril (ALTACE) 2.5 MG capsule Take 1 capsule (2.5 mg total) by mouth daily. 30 capsule 2  .  triamterene-hydrochlorothiazide (MAXZIDE-25) 37.5-25 MG tablet TAKE 1 TABLET BY MOUTH ONCE DAILY IN THE MORNING 30 tablet 0   No facility-administered medications prior to visit.     Final Medications at End of Visit    No outpatient medications have been marked as taking for the 04/16/21 encounter (Appointment) with 08-20-1992, Jenniger Figiel C, PA-C.    Radiology   No results found.  Cardiac Studies:   Echocardiogram 04/12/2019: Normal LV systolic function with EF 55%. Left ventricle cavity is mildly dilated. Normal global wall motion. Normal diastolic filling pattern. Calculated EF  55%. Left atrial cavity is mildly dilated. Mild (Grade I) mitral regurgitation. Mild to moderate tricuspid regurgitation. Estimated pulmonary artery systolic pressure 31 mmHg. RVSP measures 31 mmHg. No significant change compared to previous study in 2017.   EKG   ***  EKG 08/10/2018:Sinus rhythm 63 bpm. Normal axis. Normal conduction. Normal EKG.  Assessment   No diagnosis found. No orders of the defined types were placed in this encounter.  There are no discontinued medications.  Recommendations:  Troy Morris  is a 45 y.o. male  with hyperthyroidism, previously on methimazole and atenolol, however had ran out of his medications for one month and presented to the hospital on 03/21/2016 with tachycardia, coughing, and shortness of breath. Radiation therapy has been recommended in the past, however he has never proceeded with this option due to financial concerns. On admission, he was noted to be in A. fib with RVR with accelerated hypertension. Echocardiogram during hospitalization revealed markedly reduced LVEF at 20%.   After restarting thyroid therapy, and treatment of hypertension, EF normalized by echo in Sept 2019 and since then has not had any CHF episodes. There has not been recurrence of atrial fibrillation and he is not on anticoagulation due to low cardioembolic risk.    ***Patient was  last seen by Dr. Jacinto Halim 04/19/2019 via telemedicine at which time he was stable from a cardiovascular standpoint and recommended for as needed follow-up.  Patient now presents for urgent visit with concerns of ***  ***  ***  Patient with thyroid crisis due to hyperthyroidism, in 2017 had developed acute decompensated heart failure which resolved on therapy.   No anticoagulation is indicated with regard to atrial fibrillation after is no recurrence.   On his last office visit 6 weeks ago, I started him on Maxide 37.5/25 mg, for hypertension which he  has not started.  I reviewed the results of the echocardiogram, essentially revealing mild cardiomegaly but well preserved LVEF.  I have encouraged him to have thyroid ablation in view of his age it would be the best option.  Weight  loss and regular exercise discussed with the patient.  From cardiac standpoint he remained stable, he needs close follow-up of hypertension.  I will be happy to see him back in 6 months if he so chooses.  Otherwise he can follow-up with Dr. Mikeal Hawthorne.  Rayford Halsted, MD, [facility] 04/15/2021, 11:09 AM [facility] Pager: [phone] Office: [phone] If no answer Cell [phone]

## 2021-04-16 ENCOUNTER — Ambulatory Visit: Payer: BLUE CROSS/BLUE SHIELD | Admitting: Student

## 2021-04-16 NOTE — Telephone Encounter (Signed)
error 

## 2021-04-19 DIAGNOSIS — I10 Essential (primary) hypertension: Secondary | ICD-10-CM | POA: Diagnosis not present

## 2021-04-19 DIAGNOSIS — G4719 Other hypersomnia: Secondary | ICD-10-CM | POA: Diagnosis not present

## 2021-04-19 DIAGNOSIS — F5112 Insufficient sleep syndrome: Secondary | ICD-10-CM | POA: Diagnosis not present

## 2021-04-19 DIAGNOSIS — G4726 Circadian rhythm sleep disorder, shift work type: Secondary | ICD-10-CM | POA: Diagnosis not present

## 2021-05-05 ENCOUNTER — Ambulatory Visit: Payer: BLUE CROSS/BLUE SHIELD | Admitting: Cardiology

## 2021-05-13 DIAGNOSIS — R002 Palpitations: Secondary | ICD-10-CM | POA: Diagnosis not present

## 2021-05-13 DIAGNOSIS — K219 Gastro-esophageal reflux disease without esophagitis: Secondary | ICD-10-CM | POA: Diagnosis not present

## 2021-05-13 DIAGNOSIS — M545 Low back pain, unspecified: Secondary | ICD-10-CM | POA: Diagnosis not present

## 2021-05-20 DIAGNOSIS — G4733 Obstructive sleep apnea (adult) (pediatric): Secondary | ICD-10-CM | POA: Diagnosis not present

## 2021-05-20 DIAGNOSIS — F5112 Insufficient sleep syndrome: Secondary | ICD-10-CM | POA: Diagnosis not present

## 2021-05-20 DIAGNOSIS — E669 Obesity, unspecified: Secondary | ICD-10-CM | POA: Diagnosis not present

## 2021-06-10 ENCOUNTER — Ambulatory Visit: Payer: BLUE CROSS/BLUE SHIELD | Admitting: Cardiology

## 2021-08-10 DIAGNOSIS — Z0000 Encounter for general adult medical examination without abnormal findings: Secondary | ICD-10-CM | POA: Diagnosis not present

## 2021-08-10 DIAGNOSIS — Z23 Encounter for immunization: Secondary | ICD-10-CM | POA: Diagnosis not present

## 2021-09-24 ENCOUNTER — Ambulatory Visit: Payer: BLUE CROSS/BLUE SHIELD | Admitting: Cardiology

## 2021-10-14 ENCOUNTER — Ambulatory Visit: Payer: BC Managed Care – PPO | Admitting: Student

## 2021-10-14 ENCOUNTER — Other Ambulatory Visit: Payer: Self-pay

## 2021-10-14 ENCOUNTER — Encounter: Payer: Self-pay | Admitting: Student

## 2021-10-14 VITALS — BP 108/56 | Temp 98.1°F | Ht 73.0 in | Wt 274.0 lb

## 2021-10-14 DIAGNOSIS — R600 Localized edema: Secondary | ICD-10-CM | POA: Diagnosis not present

## 2021-10-14 DIAGNOSIS — I10 Essential (primary) hypertension: Secondary | ICD-10-CM

## 2021-10-14 DIAGNOSIS — R002 Palpitations: Secondary | ICD-10-CM

## 2021-10-14 DIAGNOSIS — Z8679 Personal history of other diseases of the circulatory system: Secondary | ICD-10-CM | POA: Diagnosis not present

## 2021-10-14 MED ORDER — METOPROLOL TARTRATE 50 MG PO TABS: 50.0000 mg | ORAL_TABLET | Freq: Two times a day (BID) | ORAL | 3 refills | Status: DC

## 2021-10-14 MED ORDER — TRIAMTERENE-HCTZ 37.5-25 MG PO TABS: 0.5000 | ORAL_TABLET | Freq: Every day | ORAL | 3 refills | Status: AC

## 2021-10-14 NOTE — Progress Notes (Signed)
Primary Physician/Referring:  Tally Joe, MD  Patient ID: Troy Morris, male    DOB: 27-Jan-1976, 45 y.o.   MRN: [id number]  Chief Complaint  Patient presents with   Palpitations   HPI:    Troy Morris  is a 45 y.o. AA male with history of hypertension and hypothyroidism.  Patient was last seen in our office by Dr. Jacinto Halim 04/19/2019 as patient developed acute heart failure when he presented to the hospital in A. fib with RVR secondary to hyperthyroidism.  Patient's LVEF normalized and patient was advised to follow-up as needed.  He is now referred back to our office by PCP for evaluation of bilateral leg swelling.  Patient reports over the last several weeks he has noticed intermittent leg swelling as well as occasional episodes of orthopnea.  He he is presently taking Maxide every other day as he is frustrated by the frequency with which she urinates at work.  PCP recently reduced Lopressor to 100 mg once daily due to bradycardia per patient.  Notably at last visit in our office he was advised to follow-up with endocrinology for evaluation of thyroid ablation.   Patient has recently been diagnosed with sleep apnea and is awaiting delivery of CPAP.   Past Medical History:  Diagnosis Date   Acute diastolic CHF (congestive heart failure) (HCC)    CHF (congestive heart failure) (HCC) 03/21/2016   Fluid overload 03/22/2016   LV dysfunction 03/23/2016   Thyroid disease    Past Surgical History:  Procedure Laterality Date   steel face plate     7106, after football facial injury from collision   Family History  Problem Relation Age of Onset   Diabetes Mother    CAD Neg Hx    Stroke Neg Hx    Cancer Neg Hx    Clotting disorder Neg Hx     Social History   Tobacco Use   Smoking status: Never   Smokeless tobacco: Never  Substance Use Topics   Alcohol use: No   Marital Status: Married   ROS  Review of Systems  Constitutional: Negative for malaise/fatigue and weight gain.   Cardiovascular:  Negative for chest pain, claudication, leg swelling, near-syncope, orthopnea, palpitations, paroxysmal nocturnal dyspnea and syncope.  Respiratory:  Negative for shortness of breath.   Neurological:  Negative for dizziness.   Objective  Blood pressure (!) 108/56, temperature 98.1 F (36.7 C), height 6\' 1"  (1.854 m), weight 274 lb (124.3 kg).  Vitals with BMI 10/14/2021 11/24/2020 08/18/2020  Height 6\' 1"  6\' 1"  6\' 1"   Weight 274 lbs 277 lbs 8 oz 274 lbs  BMI 36.16 36.62 36.16  Systolic 108 130 10/18/2020  Diastolic 56 76 78  Pulse - 58 57      Physical Exam Vitals reviewed.  Constitutional:      Appearance: He is obese.  Cardiovascular:     Rate and Rhythm: Normal rate and regular rhythm.     Pulses: Intact distal pulses.     Heart sounds: S1 normal and S2 normal. No murmur heard.   No gallop.  Pulmonary:     Effort: Pulmonary effort is normal. No respiratory distress.     Breath sounds: No wheezing, rhonchi or rales.  Musculoskeletal:     Right lower leg: Edema (minmal) present.     Left lower leg: Edema (minimal) present.  Neurological:     Mental Status: He is alert.    Laboratory examination:   No results for input(s): NA, K,  CL, CO2, GLUCOSE, BUN, CREATININE, CALCIUM, GFRNONAA, GFRAA in the last 8760 hours. CrCl cannot be calculated (Patient's most recent lab result is older than the maximum 21 days allowed.).  CMP Latest Ref Rng & Units 08/18/2020 03/25/2019 03/26/2016  Glucose 65 - 99 mg/dL 79 99 78  BUN 7 - 25 mg/dL 11 14 11   Creatinine 0.60 - 1.35 mg/dL 0.99 0.84 0.75  Sodium 135 - 146 mmol/L 138 142 139  Potassium 3.5 - 5.3 mmol/L 4.2 4.4 4.3  Chloride 98 - 110 mmol/L 102 106 102  CO2 20 - 32 mmol/L 26 23 27   Calcium 8.6 - 10.3 mg/dL 9.0 9.0 8.5(L)  Total Protein 6.1 - 8.1 g/dL 6.8 6.6 -  Total Bilirubin 0.2 - 1.2 mg/dL 1.5(H) 1.5(H) -  Alkaline Phos 39 - 117 IU/L - 95 -  AST 10 - 40 U/L 11 11 -  ALT 9 - 46 U/L 10 12 -   CBC Latest Ref Rng & Units  08/18/2020 03/25/2016 03/24/2016  WBC 3.8 - 10.8 Thousand/uL 7.5 6.6 7.0  Hemoglobin 13.2 - 17.1 g/dL 14.6 11.9(L) 11.5(L)  Hematocrit 38.5 - 50.0 % 44.7 36.3(L) 34.6(L)  Platelets 140 - 400 Thousand/uL 239 173 165    Lipid Panel No results for input(s): CHOL, TRIG, LDLCALC, VLDL, HDL, CHOLHDL, LDLDIRECT in the last 8760 hours.  HEMOGLOBIN A1C No results found for: HGBA1C, MPG TSH Recent Labs    11/24/20 1328  TSH 0.38    External labs:  04/14/2021: BUN 16, creatinine 1.12, GFR 82, sodium 136, potassium 4.3 Hgb 15, HCT 45.1, MCV 85.4 TSH 3.4  Allergies  No Known Allergies   Medications Prior to Visit:   Outpatient Medications Prior to Visit  Medication Sig Dispense Refill   ibuprofen (ADVIL,MOTRIN) 200 MG tablet Take 200 mg by mouth every 6 (six) hours as needed for headache.     methimazole (TAPAZOLE) 10 MG tablet Take 1.5 tablets (15 mg total) by mouth daily. 45 tablet 6   ramipril (ALTACE) 2.5 MG capsule Take 1 capsule (2.5 mg total) by mouth daily. 30 capsule 2   zinc gluconate 50 MG tablet Take 50 mg by mouth daily.     metoprolol (LOPRESSOR) 100 MG tablet Take 1 tablet (100 mg total) by mouth 2 (two) times daily. (Patient taking differently: Take 200 mg by mouth daily.) 60 tablet 0   triamterene-hydrochlorothiazide (MAXZIDE-25) 37.5-25 MG tablet TAKE 1 TABLET BY MOUTH ONCE DAILY IN THE MORNING 30 tablet 0   No facility-administered medications prior to visit.   Final Medications at End of Visit    Current Meds  Medication Sig   ibuprofen (ADVIL,MOTRIN) 200 MG tablet Take 200 mg by mouth every 6 (six) hours as needed for headache.   methimazole (TAPAZOLE) 10 MG tablet Take 1.5 tablets (15 mg total) by mouth daily.   ramipril (ALTACE) 2.5 MG capsule Take 1 capsule (2.5 mg total) by mouth daily.   zinc gluconate 50 MG tablet Take 50 mg by mouth daily.   [DISCONTINUED] metoprolol (LOPRESSOR) 100 MG tablet Take 1 tablet (100 mg total) by mouth 2 (two) times daily.  (Patient taking differently: Take 200 mg by mouth daily.)   [DISCONTINUED] triamterene-hydrochlorothiazide (MAXZIDE-25) 37.5-25 MG tablet TAKE 1 TABLET BY MOUTH ONCE DAILY IN THE MORNING   Radiology:   No results found.  Cardiac Studies:   Echocardiogram 04/12/2019:  Normal LV systolic function with EF 55%. Left ventricle cavity is mildly  dilated. Normal global wall motion. Normal diastolic filling pattern.  Calculated EF 55%.  Left atrial cavity is mildly dilated.  Mild (Grade I) mitral regurgitation.  Mild to moderate tricuspid regurgitation. Estimated pulmonary artery  systolic pressure 31 mmHg. RVSP measures 31 mmHg.  No significant change compared to previous study in 2017.  EKG:   10/14/2021: Sinus bradycardia rate 51 bpm.  Left axis.  No evidence of ischemia or underlying injury pattern.  Low voltage complexes.  Assessment     ICD-10-CM   1. Benign essential HTN  I10 triamterene-hydrochlorothiazide (MAXZIDE-25) 37.5-25 MG tablet    2. Leg edema  R60.0 PCV ECHOCARDIOGRAM COMPLETE    Brain natriuretic peptide    3. Palpitations  R00.2 EKG 12-Lead    4. History of acute heart failure  Z86.79        Medications Discontinued During This Encounter  Medication Reason   metoprolol (LOPRESSOR) 100 MG tablet Reorder   triamterene-hydrochlorothiazide (MAXZIDE-25) 37.5-25 MG tablet     Meds ordered this encounter  Medications   metoprolol tartrate (LOPRESSOR) 50 MG tablet    Sig: Take 1 tablet (50 mg total) by mouth 2 (two) times daily.    Dispense:  60 tablet    Refill:  3   triamterene-hydrochlorothiazide (MAXZIDE-25) 37.5-25 MG tablet    Sig: Take 0.5 tablets by mouth daily.    Dispense:  15 tablet    Refill:  3    Recommendations:   Troy Morris is a 45 y.o. AA male with history of hypertension and hypothyroidism.  Patient was last seen in our office by Dr. Einar Gip 04/19/2019 as patient developed acute heart failure when he presented to the hospital in A. fib  with RVR secondary to hyperthyroidism.  Patient's LVEF normalized and patient was advised to follow-up as needed.  He is now referred back to our office by PCP for evaluation of bilateral leg swelling.  Given patient's history of acute heart failure and atrial fibrillation recommended further evaluation in the setting of patient's symptoms including shortness of breath and leg edema.  We will obtain echocardiogram to reevaluate heart function.  EKG today notes sinus bradycardia and blood pressure is soft.  We will therefore reduce Maxide to half a tablet once daily and Lopressor to 50 mg p.o. twice daily.  Given leg edema we will also obtain BNP at this time.  Patient does have history of atrial fibrillation, therefore discussed undergoing 2-week cardiac monitor, however patient opted to hold off at this time, he would prefer to first see if symptoms improved with medication adjustments.   Follow up in 6 weeks.    Alethia Berthold, PA-C 10/14/2021, 4:03 PM Office: [phone]

## 2021-10-21 ENCOUNTER — Other Ambulatory Visit: Payer: BC Managed Care – PPO

## 2021-11-01 ENCOUNTER — Other Ambulatory Visit: Payer: BC Managed Care – PPO

## 2021-11-16 ENCOUNTER — Other Ambulatory Visit: Payer: BC Managed Care – PPO

## 2021-11-23 ENCOUNTER — Encounter: Payer: Self-pay | Admitting: Internal Medicine

## 2021-11-23 ENCOUNTER — Ambulatory Visit (INDEPENDENT_AMBULATORY_CARE_PROVIDER_SITE_OTHER): Payer: BC Managed Care – PPO | Admitting: Internal Medicine

## 2021-11-23 VITALS — BP 128/76 | HR 64 | Ht 73.0 in | Wt 274.0 lb

## 2021-11-23 DIAGNOSIS — E0500 Thyrotoxicosis with diffuse goiter without thyrotoxic crisis or storm: Secondary | ICD-10-CM | POA: Insufficient documentation

## 2021-11-23 DIAGNOSIS — E0590 Thyrotoxicosis, unspecified without thyrotoxic crisis or storm: Secondary | ICD-10-CM | POA: Diagnosis not present

## 2021-11-23 NOTE — Progress Notes (Signed)
Name: Troy Morris  MRN/ DOB: [id number], 05/04/1976    Age/ Sex: 46 y.o., male     PCP: Tally Joe, MD   Reason for Endocrinology Evaluation: Hyperthyroidism     Initial Endocrinology Clinic Visit: 08/18/2020    PATIENT IDENTIFIER: Troy Morris is a 46 y.o., male with a past medical history of HTN, A.Fib  and CHF. He has followed with [facility] since 08/18/2020 for consultative assistance with management of his hyperthyroidism   HISTORICAL SUMMARY:   Pt has been diagnosed with hyperthyroidism in 2007. He has been on Methimazole ever since.  TRAb was elevated 08/2020 at 9.28 IU/L.    Has also has been diagnosed with CHF in 2017, and A. Fib in ~ 2017/2018 , follows with Dr. Nadara Eaton  Denies osteoporosis   Tyroid ULtrasound showed no evidence of nodules (09/2020)   No FH of thyroid disease SUBJECTIVE:    Today (11/23/2021):  [name] is here for a follow up on hyperthyroidism. He has not been to our clinic in 12 months.      Weight has been stable  Denies fever or abdominal pain  Has occasional palpitations  As well as right neck pain ( posteriorly ) with supraclavicular swelling  As well as anterior neck pain   Has been snoring, did not follow up on sleep study     Methimazole 10 mg , 1 tabs daily    HISTORY:  Past Medical History:  Past Medical History:  Diagnosis Date   Acute diastolic CHF (congestive heart failure) (HCC)    CHF (congestive heart failure) (HCC) 03/21/2016   Fluid overload 03/22/2016   LV dysfunction 03/23/2016   Thyroid disease    Past Surgical History:  Past Surgical History:  Procedure Laterality Date   steel face plate     8295, after football facial injury from collision   Social History:  reports that he has never smoked. He has never used smokeless tobacco. He reports that he does not drink alcohol and does not use drugs. Family History:  Family History  Problem Relation Age of Onset   Diabetes Mother     CAD Neg Hx    Stroke Neg Hx    Cancer Neg Hx    Clotting disorder Neg Hx      HOME MEDICATIONS: Allergies as of 11/23/2021   No Known Allergies      Medication List        Accurate as of November 23, 2021  2:36 PM. If you have any questions, ask your nurse or doctor.          ibuprofen 200 MG tablet Commonly known as: ADVIL Take 200 mg by mouth every 6 (six) hours as needed for headache.   methimazole 10 MG tablet Commonly known as: TAPAZOLE Take 1.5 tablets (15 mg total) by mouth daily.   metoprolol tartrate 50 MG tablet Commonly known as: LOPRESSOR Take 1 tablet (50 mg total) by mouth 2 (two) times daily.   ramipril 2.5 MG capsule Commonly known as: ALTACE Take 1 capsule (2.5 mg total) by mouth daily.   triamterene-hydrochlorothiazide 37.5-25 MG tablet Commonly known as: MAXZIDE-25 Take 0.5 tablets by mouth daily.   zinc gluconate 50 MG tablet Take 50 mg by mouth daily.          OBJECTIVE:   PHYSICAL EXAM: VS: BP 128/76 (BP Location: Left Arm, Patient Position: Sitting, Cuff Size: Small)    Pulse 64    Ht 6\' 1"  (  1.854 m)    Wt 274 lb (124.3 kg)    SpO2 94%    BMI 36.15 kg/m    EXAM: General: Pt appears well and is in NAD  Eyes: External eye exam normal with a stare,but no  lid lag or exophthalmos.  EOM intact.    Neck: General: Supple without adenopathy. Thyroid: Thyroid size normal.  Isthmic 1.5 cm nodule palpated   Lungs: Clear with good BS bilat with no rales, rhonchi, or wheezes  Heart: Auscultation: RRR.  Abdomen: Normoactive bowel sounds, soft, nontender, without masses or organomegaly palpable  Extremities:  BL LE: No pretibial edema normal ROM and strength.  Mental Status: Judgment, insight: Intact Mood and affect: No depression, anxiety, or agitation     DATA REVIEWED: Results for LAKE, CINQUEMANI (MRN [id number]) as of 11/25/2020 11:46  Ref. Range 11/24/2020 13:28  TSH Latest Ref Range: 0.35 - 4.50 uIU/mL 0.38  T4,Free(Direct) Latest  Ref Range: 0.60 - 1.60 ng/dL 2.75    Results for ARLEE, SANTOSUOSSO (MRN [id number]) as of 11/24/2020 12:44  Ref. Range 08/18/2020 13:53  TRAB Latest Ref Range: <=2.00 IU/L 9.28 (H)   Thyroid ULtrasound 09/2020   Estimated total number of nodules >/= 1 cm: 0   Number of spongiform nodules >/=  2 cm not described below (TR1): 0   Number of mixed cystic and solid nodules >/= 1.5 cm not described below (TR2): 0   _________________________________________________________   Similar marked heterogeneity and thyroid enlargement. No hypervascularity or focal abnormality. No new or enlarging nodule. No bulky adenopathy.   IMPRESSION: Stable heterogeneous enlarged thyroid compatible with medical thyroid disease.  ASSESSMENT / PLAN / RECOMMENDATIONS:   Hyperthyroidism Secondary to Graves' Disease :    - Pt is clinically euthyroid  - We discussed Graves' disease is an immune condition and could affect the eyes  - Despite his ultrasound showing no nodules, on exam he does have an isthmic nodule ~ 1.5 cm , will repeat today  - Pt left today without have his labs drawn    Medications   Continue Methimazole 10 mg , 1 tablet daily     2. Graves' Disease:   - No extrathyroidal Manifestations of Graves' disease    F/U in 4 months    2. Graves' Disease:  Signed electronically by: Lyndle Herrlich, MD  [facility]  [facility] [address] [place], [place] [address] Phone: [phone] FAX: [phone]      CC: Tally Joe, MD [address] [place] [address] Phone: [phone]  Fax: [phone]   Return to Endocrinology clinic as below: Future Appointments  Date Time Provider Department Center  11/23/2021  2:40 PM Ellianne Gowen, Konrad Dolores, MD [facility]  11/29/2021  3:15 PM [facility]-ECHO/VAS 2 [facility] None  12/09/2021  2:30 PM [place], Renne Musca, PA-C [facility] None

## 2021-11-24 MED ORDER — METHIMAZOLE 10 MG PO TABS: 10.0000 mg | ORAL_TABLET | Freq: Every day | ORAL | 1 refills | Status: DC

## 2021-11-29 ENCOUNTER — Other Ambulatory Visit: Payer: BC Managed Care – PPO

## 2021-12-09 ENCOUNTER — Ambulatory Visit: Payer: BC Managed Care – PPO | Admitting: Student

## 2021-12-09 NOTE — Progress Notes (Deleted)
Primary Physician/Referring:  Tally Joe, MD  Patient ID: Troy Morris, male    DOB: 08/29/1976, 46 y.o.   MRN: [id number]  No chief complaint on file.  HPI:    Troy Morris  is a 46 y.o. AA male with history of hypertension and hypothyroidism.  Patient was last seen in our office by Dr. Jacinto Halim 04/19/2019 as patient developed acute heart failure when he presented to the hospital in A. fib with RVR secondary to hyperthyroidism.  Patient's LVEF normalized and patient was advised to follow-up as needed.  He was referred back to our office by PCP for evaluation of bilateral leg swelling.  Patient presents for 8-week follow-up.  At last office visit due to bradycardia and soft blood pressure reduce Maxide to half a tablet once daily and Lopressor to 50 mg twice daily.  BNP ordered at last office visit has unfortunately not been done.  Patient reports over the last several weeks he has noticed intermittent leg swelling as well as occasional episodes of orthopnea.  He he is presently taking Maxide every other day as he is frustrated by the frequency with which she urinates at work.  PCP recently reduced Lopressor to 100 mg once daily due to bradycardia per patient.  Notably at last visit in our office he was advised to follow-up with endocrinology for evaluation of thyroid ablation.   Patient has recently been diagnosed with sleep apnea and is awaiting delivery of CPAP.   Past Medical History:  Diagnosis Date   Acute diastolic CHF (congestive heart failure) (HCC)    CHF (congestive heart failure) (HCC) 03/21/2016   Fluid overload 03/22/2016   LV dysfunction 03/23/2016   Thyroid disease    Past Surgical History:  Procedure Laterality Date   steel face plate     0786, after football facial injury from collision   Family History  Problem Relation Age of Onset   Diabetes Mother    CAD Neg Hx    Stroke Neg Hx    Cancer Neg Hx    Clotting disorder Neg Hx     Social History   Tobacco Use    Smoking status: Never   Smokeless tobacco: Never  Substance Use Topics   Alcohol use: No   Marital Status: Married   ROS  Review of Systems  Constitutional: Negative for malaise/fatigue and weight gain.  Cardiovascular:  Negative for chest pain, claudication, leg swelling, near-syncope, orthopnea, palpitations, paroxysmal nocturnal dyspnea and syncope.  Respiratory:  Negative for shortness of breath.   Neurological:  Negative for dizziness.   Objective  There were no vitals taken for this visit.  Vitals with BMI 11/23/2021 10/14/2021 11/24/2020  Height 6\' 1"  6\' 1"  6\' 1"   Weight 274 lbs 274 lbs 277 lbs 8 oz  BMI 36.16 36.16 36.62  Systolic 128 108 754  Diastolic 76 56 76  Pulse 64 - 58      Physical Exam Vitals reviewed.  Constitutional:      Appearance: He is obese.  Cardiovascular:     Rate and Rhythm: Normal rate and regular rhythm.     Pulses: Intact distal pulses.     Heart sounds: S1 normal and S2 normal. No murmur heard.   No gallop.  Pulmonary:     Effort: Pulmonary effort is normal. No respiratory distress.     Breath sounds: No wheezing, rhonchi or rales.  Musculoskeletal:     Right lower leg: Edema (minmal) present.     Left lower  leg: Edema (minimal) present.  Neurological:     Mental Status: He is alert.    Laboratory examination:   No results for input(s): NA, K, CL, CO2, GLUCOSE, BUN, CREATININE, CALCIUM, GFRNONAA, GFRAA in the last 8760 hours. CrCl cannot be calculated (Patient's most recent lab result is older than the maximum 21 days allowed.).  CMP Latest Ref Rng & Units 08/18/2020 03/25/2019 03/26/2016  Glucose 65 - 99 mg/dL 79 99 78  BUN 7 - 25 mg/dL 11 14 11   Creatinine 0.60 - 1.35 mg/dL 0.99 0.84 0.75  Sodium 135 - 146 mmol/L 138 142 139  Potassium 3.5 - 5.3 mmol/L 4.2 4.4 4.3  Chloride 98 - 110 mmol/L 102 106 102  CO2 20 - 32 mmol/L 26 23 27   Calcium 8.6 - 10.3 mg/dL 9.0 9.0 8.5(L)  Total Protein 6.1 - 8.1 g/dL 6.8 6.6 -  Total Bilirubin  0.2 - 1.2 mg/dL 1.5(H) 1.5(H) -  Alkaline Phos 39 - 117 IU/L - 95 -  AST 10 - 40 U/L 11 11 -  ALT 9 - 46 U/L 10 12 -   CBC Latest Ref Rng & Units 08/18/2020 03/25/2016 03/24/2016  WBC 3.8 - 10.8 Thousand/uL 7.5 6.6 7.0  Hemoglobin 13.2 - 17.1 g/dL 14.6 11.9(L) 11.5(L)  Hematocrit 38.5 - 50.0 % 44.7 36.3(L) 34.6(L)  Platelets 140 - 400 Thousand/uL 239 173 165    Lipid Panel No results for input(s): CHOL, TRIG, LDLCALC, VLDL, HDL, CHOLHDL, LDLDIRECT in the last 8760 hours.  HEMOGLOBIN A1C No results found for: HGBA1C, MPG TSH No results for input(s): TSH in the last 8760 hours.   External labs:  04/14/2021: BUN 16, creatinine 1.12, GFR 82, sodium 136, potassium 4.3 Hgb 15, HCT 45.1, MCV 85.4 TSH 3.4  Allergies  No Known Allergies   Medications Prior to Visit:   Outpatient Medications Prior to Visit  Medication Sig Dispense Refill   ibuprofen (ADVIL,MOTRIN) 200 MG tablet Take 200 mg by mouth every 6 (six) hours as needed for headache.     methimazole (TAPAZOLE) 10 MG tablet Take 1 tablet (10 mg total) by mouth daily. 90 tablet 1   metoprolol tartrate (LOPRESSOR) 50 MG tablet Take 1 tablet (50 mg total) by mouth 2 (two) times daily. 60 tablet 3   ramipril (ALTACE) 2.5 MG capsule Take 1 capsule (2.5 mg total) by mouth daily. 30 capsule 2   triamterene-hydrochlorothiazide (MAXZIDE-25) 37.5-25 MG tablet Take 0.5 tablets by mouth daily. 15 tablet 3   zinc gluconate 50 MG tablet Take 50 mg by mouth daily.     No facility-administered medications prior to visit.   Final Medications at End of Visit    No outpatient medications have been marked as taking for the 12/09/21 encounter (Appointment) with Rayetta Pigg, Coolidge Gossard C, PA-C.   Radiology:   No results found.  Cardiac Studies:   Echocardiogram 04/12/2019:  Normal LV systolic function with EF 55%. Left ventricle cavity is mildly  dilated. Normal global wall motion. Normal diastolic filling pattern.  Calculated EF 55%.  Left  atrial cavity is mildly dilated.  Mild (Grade I) mitral regurgitation.  Mild to moderate tricuspid regurgitation. Estimated pulmonary artery  systolic pressure 31 mmHg. RVSP measures 31 mmHg.  No significant change compared to previous study in 2017.  EKG:   10/14/2021: Sinus bradycardia rate 51 bpm.  Left axis.  No evidence of ischemia or underlying injury pattern.  Low voltage complexes.  Assessment   No diagnosis found.    There are no discontinued  medications.   No orders of the defined types were placed in this encounter.   Recommendations:   Troy Morris is a 46 y.o. AA male with history of hypertension and hypothyroidism.  Patient was last seen in our office by Dr. Einar Gip 04/19/2019 as patient developed acute heart failure when he presented to the hospital in A. fib with RVR secondary to hyperthyroidism.  Patient's LVEF normalized and patient was advised to follow-up as needed.  He is now referred back to our office by PCP for evaluation of bilateral leg swelling.  Given patient's history of acute heart failure and atrial fibrillation recommended further evaluation in the setting of patient's symptoms including shortness of breath and leg edema.  We will obtain echocardiogram to reevaluate heart function.  EKG today notes sinus bradycardia and blood pressure is soft.  We will therefore reduce Maxide to half a tablet once daily and Lopressor to 50 mg p.o. twice daily.  Given leg edema we will also obtain BNP at this time.  Patient does have history of atrial fibrillation, therefore discussed undergoing 2-week cardiac monitor, however patient opted to hold off at this time, he would prefer to first see if symptoms improved with medication adjustments.   Follow up in 6 weeks.    Alethia Berthold, PA-C 12/09/2021, 1:46 PM Office: [phone]

## 2021-12-21 ENCOUNTER — Other Ambulatory Visit: Payer: Self-pay

## 2021-12-21 ENCOUNTER — Ambulatory Visit: Payer: BC Managed Care – PPO

## 2021-12-21 DIAGNOSIS — R600 Localized edema: Secondary | ICD-10-CM

## 2021-12-27 ENCOUNTER — Other Ambulatory Visit: Payer: Self-pay

## 2021-12-27 ENCOUNTER — Ambulatory Visit (HOSPITAL_BASED_OUTPATIENT_CLINIC_OR_DEPARTMENT_OTHER)
Admission: RE | Admit: 2021-12-27 | Discharge: 2021-12-27 | Disposition: A | Discharge status: Home / Self Care | Payer: BC Managed Care – PPO | Source: Ambulatory Visit | Attending: Internal Medicine | Admitting: Internal Medicine

## 2021-12-27 DIAGNOSIS — E0500 Thyrotoxicosis with diffuse goiter without thyrotoxic crisis or storm: Secondary | ICD-10-CM | POA: Diagnosis not present

## 2021-12-27 DIAGNOSIS — E0789 Other specified disorders of thyroid: Secondary | ICD-10-CM | POA: Diagnosis not present

## 2021-12-28 ENCOUNTER — Telehealth: Payer: Self-pay

## 2021-12-28 NOTE — Telephone Encounter (Signed)
Patient was contacted about making a follow up appointment to discuss echo results.pt said he will [place] back because he didn't have his calender handy. Will send to pcv adminso they can follow up with patient about his appointment

## 2021-12-29 ENCOUNTER — Encounter: Payer: Self-pay | Admitting: Internal Medicine

## 2021-12-29 NOTE — Progress Notes (Signed)
Will you please let me know when he has been set up for appt.

## 2022-01-26 ENCOUNTER — Telehealth: Payer: Self-pay | Admitting: Student

## 2022-01-26 NOTE — Telephone Encounter (Signed)
I reviewed results of echo with patient. He is willing to be seen for office visit. Please call later in the afternoon to schedule an appointment sometime in the next 2 weeks. Thank you in advance.

## 2022-01-26 NOTE — Telephone Encounter (Signed)
Pt is calling in today regarding his test results, advised pt that we had requested him come in the office to go over the results. Pt stated he would not pay for a visit to go over results and is requesting a call. ?

## 2022-02-02 ENCOUNTER — Telehealth: Payer: Self-pay

## 2022-02-02 NOTE — Telephone Encounter (Signed)
Patient given [place] results and states that for the last month he has been having sharp pain on the right side of his neck .  ?

## 2022-02-02 NOTE — Telephone Encounter (Signed)
Patient wants to know what are the long term effects of him being on the Methimazole. ?

## 2022-02-02 NOTE — Telephone Encounter (Signed)
LMTCB regarding [place] results  ?

## 2022-03-29 DIAGNOSIS — J020 Streptococcal pharyngitis: Secondary | ICD-10-CM | POA: Diagnosis not present

## 2022-03-29 NOTE — Progress Notes (Signed)
 Subjective Patient ID: Troy Morris is a 46 y.o. male. Chief Complaint  Patient presents with  . Sore Throat    2-3 days nasal congestion, Headache  Denies fever, nausea/vomiting    Sore Throat  This is a new problem. Episode onset: 2 days. The problem has been unchanged. Neither side of throat is experiencing more pain than the other. There has been no fever. The pain is at a severity of 8/10. The pain is severe. Associated symptoms include headaches and swollen glands. Pertinent negatives include no congestion, coughing, diarrhea, drooling, ear discharge, hoarse voice, plugged ear sensation, neck pain, shortness of breath, stridor, trouble swallowing or vomiting. He has had no exposure to strep or mono. He has tried NSAIDs, cool liquids and gargles for the symptoms. The treatment provided moderate relief.    The following portions of the patient's history were reviewed and updated as appropriate: allergies, current medications, past family history, past medical history, past social history, past surgical history and problem list.  Current Outpatient Medications:  .  methIMAzole  (TAPAZOLE ) 10 MG tablet, Take 1 tablet (10 mg total) by mouth daily., Disp: , Rfl:  .  atenoloL  (TENORMIN ) 25 MG tablet, Take 1 tablet (25 mg total) by mouth., Disp: , Rfl:  .  methIMAzole  (TAPAZOLE ) 10 MG tablet,  , Disp: , Rfl:  There is no problem list on file for this patient.  No Known Allergies  Review of Systems  Constitutional: Negative for activity change, appetite change and fever.  HENT: Positive for sore throat. Negative for congestion, drooling, ear discharge, hoarse voice, postnasal drip, rhinorrhea, sinus pressure, sinus pain, sneezing and trouble swallowing.   Respiratory: Negative for cough, shortness of breath and stridor.   Gastrointestinal: Negative for diarrhea, nausea and vomiting.  Musculoskeletal: Positive for myalgias. Negative for neck pain.  Neurological: Positive for headaches.      Objective   Physical Exam Constitutional:      Appearance: Normal appearance. He is obese. He is not ill-appearing.  HENT:     Right Ear: Tympanic membrane and ear canal normal.     Left Ear: Tympanic membrane and ear canal normal.     Nose: Nose normal. No congestion or rhinorrhea.     Mouth/Throat:     Mouth: Mucous membranes are moist.     Pharynx: Posterior oropharyngeal erythema present.     Comments: Uvula is midline No purulent drainage is seen Tonsils are enlarges 2 plus b/l and left tonsil is positive for exudate Cardiovascular:     Rate and Rhythm: Normal rate and regular rhythm.  Pulmonary:     Effort: Pulmonary effort is normal.     Breath sounds: Normal breath sounds.  Musculoskeletal:     Cervical back: Neck supple.  Lymphadenopathy:     Cervical: No cervical adenopathy.  Skin:    General: Skin is warm and dry.  Neurological:     Mental Status: He is alert and oriented to person, place, and time.    Vitals:   03/29/22 1527  BP: 138/86  Pulse: 65  Temp: 98.6 F (37 C)  SpO2: 99%  Weight: 124.7 kg (275 lb)   Recent Results (from the past 72 hour(s))  POCT Rapid Strep A  Result Value Ref Range   Rapid Strep A Screen Positive (A) Reference Range:  Negative   QC VERIFIED AND ACCEPTABLE Yes    LOT NUMBER [id number]    Exp. Date 10/14/2023       Assessment   1. Sore throat  POCT Rapid Strep A    2. Strep pharyngitis  amoxicillin (AMOXIL) 875 MG tablet        Plan   RTS is positive Rx for Amoxicillin was given for 10 days Advised to continue with pushing fluids, diet as tolerated Motrin for throat pain and lozenges RTC or F/U with PCP for worsening condition     Electronically signed by: Inocente Almarie Fairly, PA-C 03/29/2022 3:36 PM    Electronically signed by: Inocente Fairly, PA-C 03/29/22 1604

## 2022-03-30 DIAGNOSIS — I10 Essential (primary) hypertension: Secondary | ICD-10-CM | POA: Diagnosis not present

## 2022-03-30 DIAGNOSIS — R635 Abnormal weight gain: Secondary | ICD-10-CM | POA: Diagnosis not present

## 2022-03-30 DIAGNOSIS — E0590 Thyrotoxicosis, unspecified without thyrotoxic crisis or storm: Secondary | ICD-10-CM | POA: Diagnosis not present

## 2022-04-04 IMAGING — US US THYROID
1 series · 14 of 25 positions shown · non-contrast
Comparison: 03/22/2016

CLINICAL DATA: Hyperthyroidism, thyromegaly

EXAM:
THYROID ULTRASOUND
TECHNIQUE: Ultrasound examination of the thyroid gland and adjacent soft
tissues was performed.

[Series 1: us thyroid · 25 acquisitions, 14 frames shown]
[im 1/25]
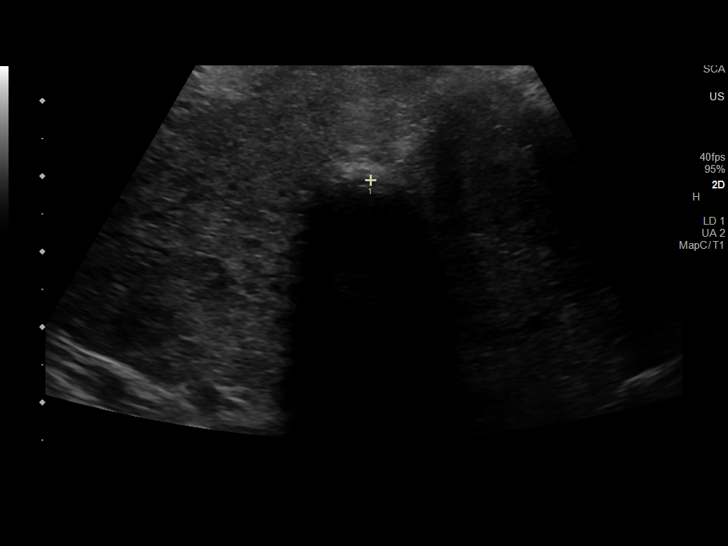
[im 3/25]
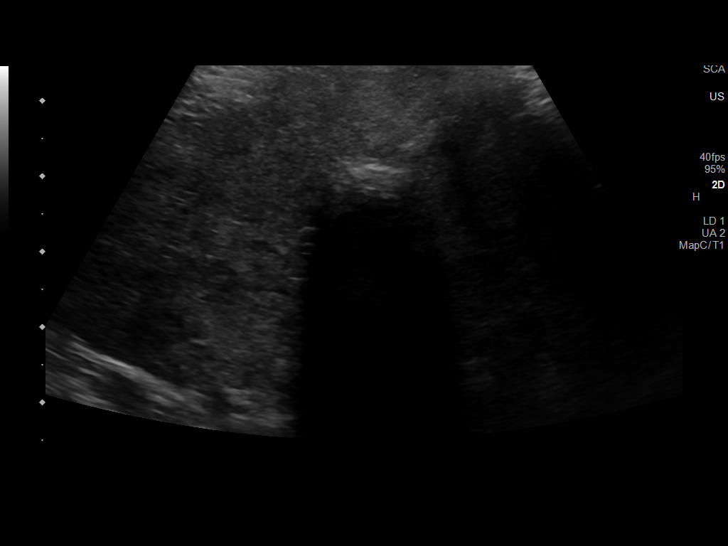
[im 5/25]
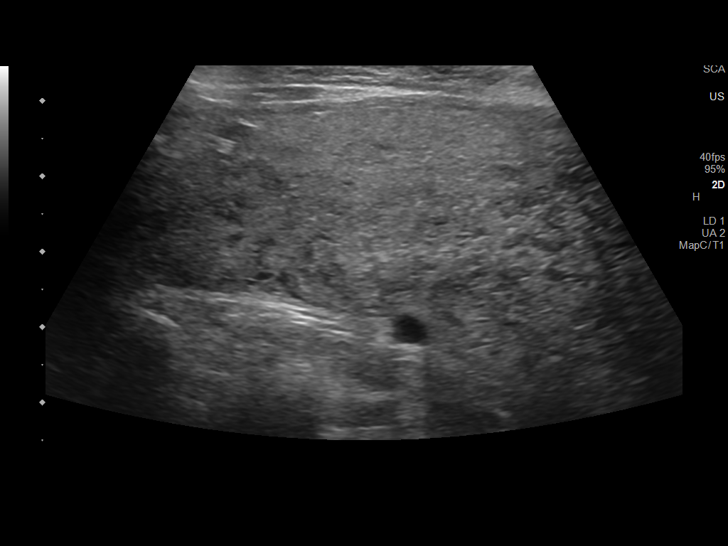
[im 7/25]
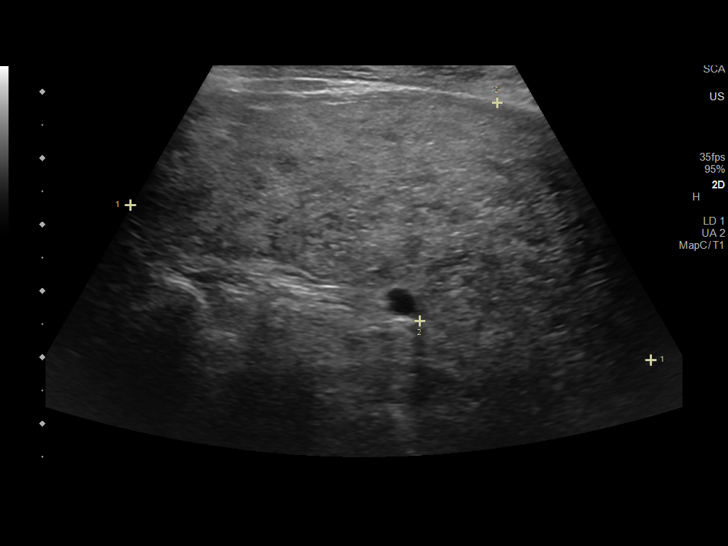
[im 9/25]
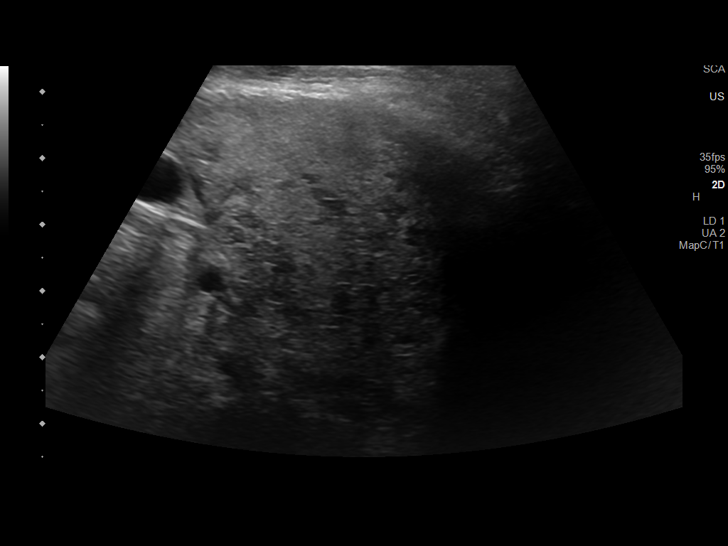
[im 10/25]
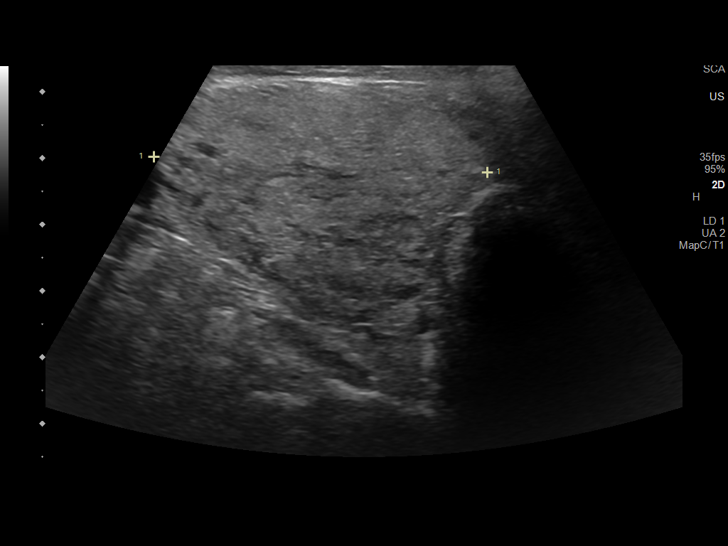
[im 12/25]
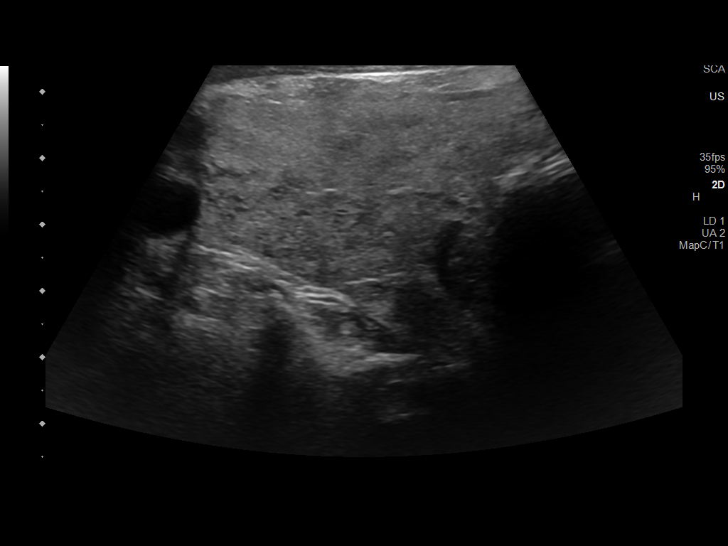
[im 14/25]
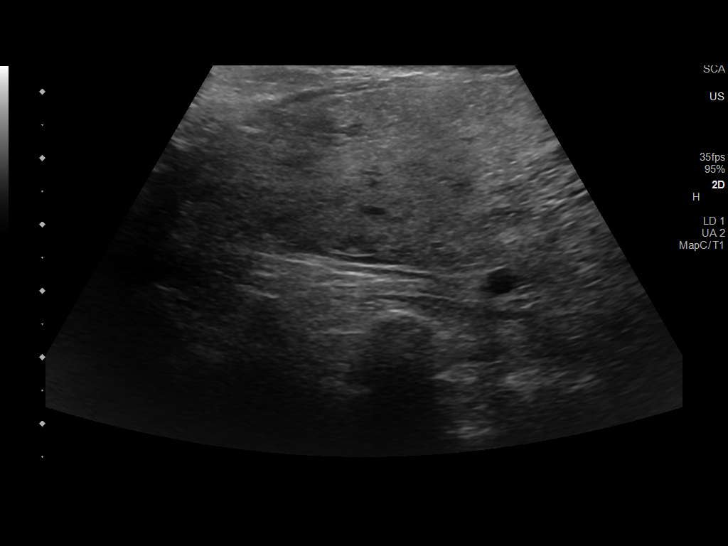
[im 16/25]
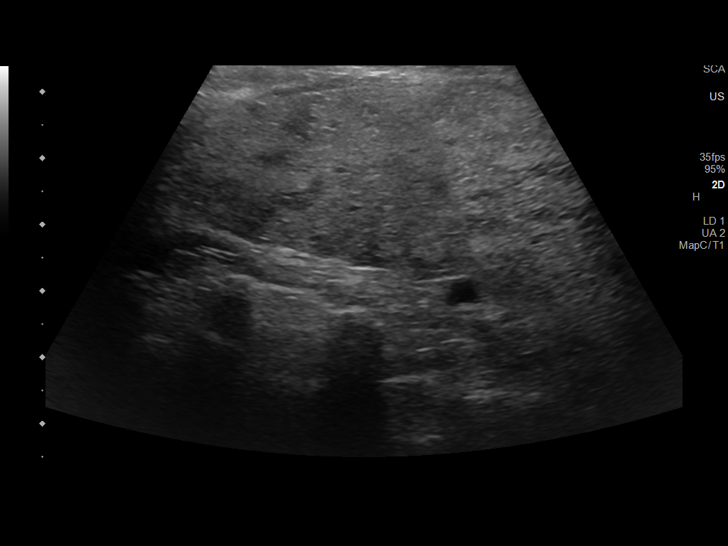
[im 17/25]
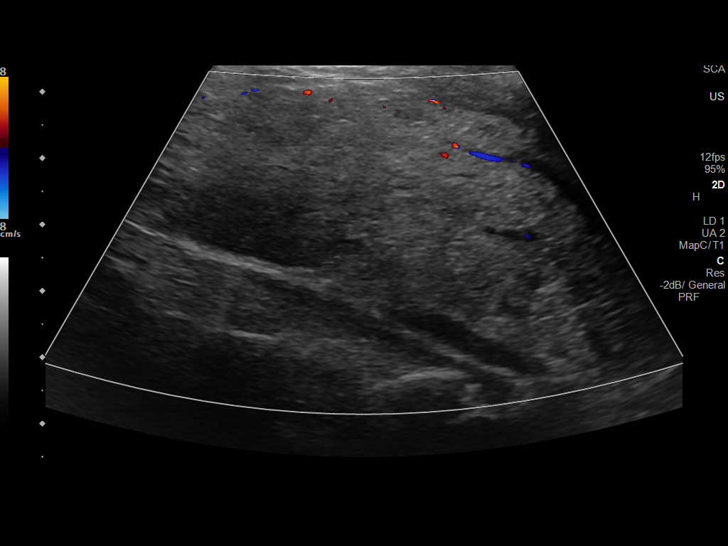
[im 19/25]
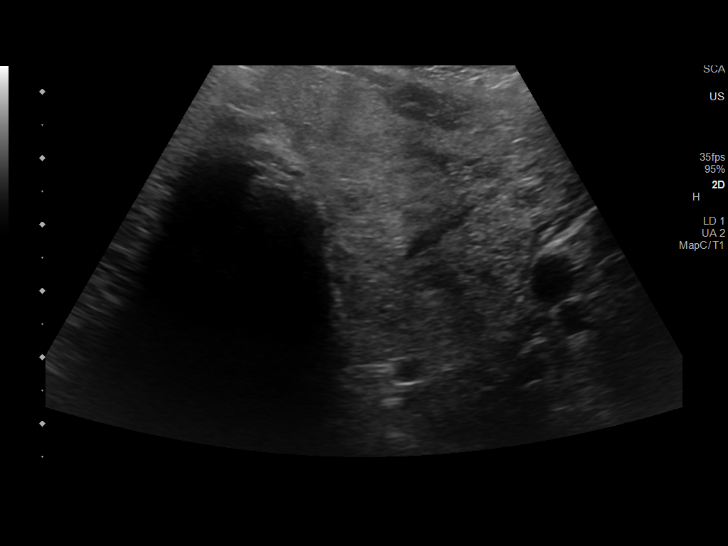
[im 21/25]
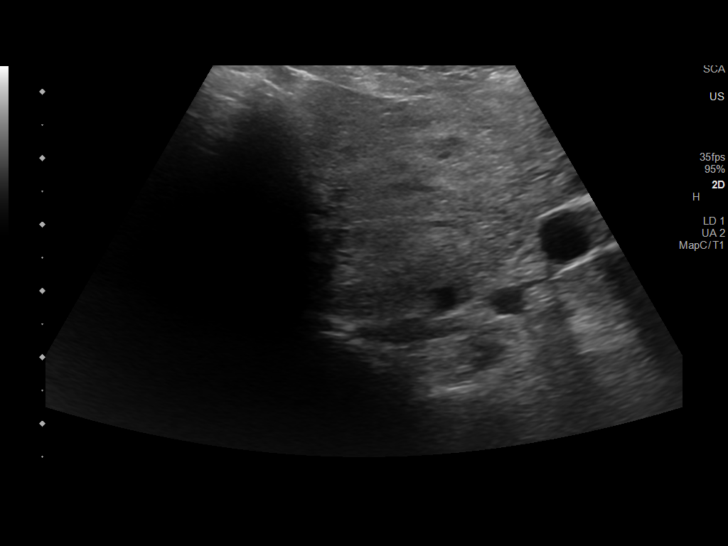
[im 23/25]
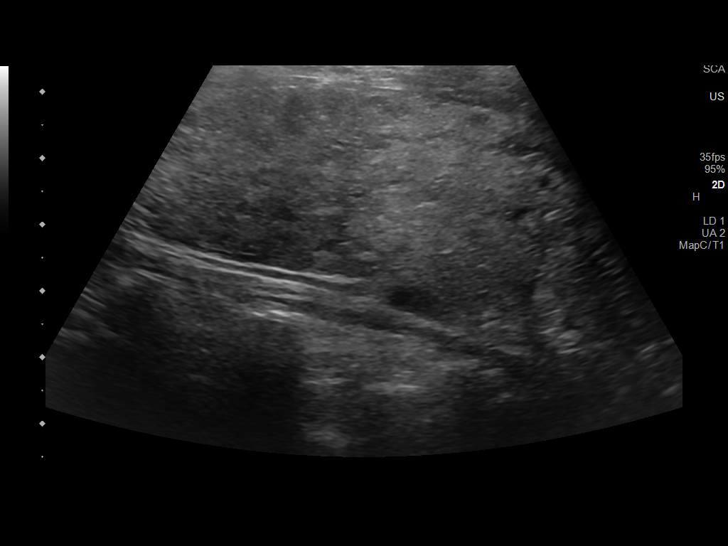
[im 25/25]
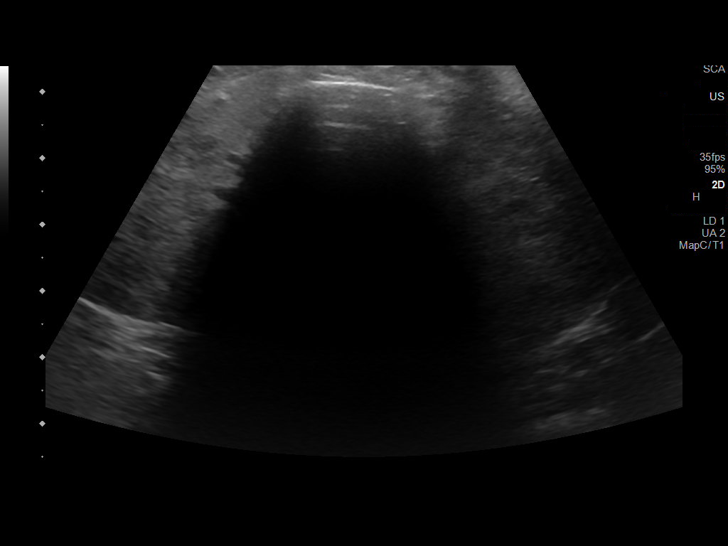

[14 of 25 positions shown; findings below may reference images not displayed]

FINDINGS: Parenchymal Echotexture: Markedly heterogenous

Isthmus: 1.6 cm

Right lobe: 8.2 x 3.5 x 5.0 cm

Left lobe: 8.5 x 3.3 x 4.1 cm

_________________________________________________________

Estimated total number of nodules >/= 1 cm: 0

Number of spongiform nodules >/=  2 cm not described below (TR1): 0

Number of mixed cystic and solid nodules >/= 1.5 cm not described
below (TR2): 0

_________________________________________________________

Similar marked heterogeneity and thyroid enlargement. No
hypervascularity or focal abnormality. No new or enlarging nodule.
No bulky adenopathy.
IMPRESSION: Stable heterogeneous enlarged thyroid compatible with medical
thyroid disease.

The above is in keeping with the ACR TI-RADS recommendations - [HOSPITAL] 9312;[DATE].

## 2022-05-24 ENCOUNTER — Encounter: Payer: Self-pay | Admitting: Internal Medicine

## 2022-05-24 ENCOUNTER — Ambulatory Visit (INDEPENDENT_AMBULATORY_CARE_PROVIDER_SITE_OTHER): Payer: BC Managed Care – PPO | Admitting: Internal Medicine

## 2022-05-24 VITALS — BP 122/70 | HR 74 | Ht 73.0 in | Wt 272.0 lb

## 2022-05-24 DIAGNOSIS — E0500 Thyrotoxicosis with diffuse goiter without thyrotoxic crisis or storm: Secondary | ICD-10-CM

## 2022-05-24 DIAGNOSIS — E559 Vitamin D deficiency, unspecified: Secondary | ICD-10-CM | POA: Diagnosis not present

## 2022-05-24 DIAGNOSIS — E069 Thyroiditis, unspecified: Secondary | ICD-10-CM

## 2022-05-24 LAB — COMPREHENSIVE METABOLIC PANEL
ALT: 12 U/L (ref 0–53)
AST: 13 U/L (ref 0–37)
Albumin: 4.2 g/dL (ref 3.5–5.2)
Alkaline Phosphatase: 68 U/L (ref 39–117)
BUN: 13 mg/dL (ref 6–23)
CO2: 28 mEq/L (ref 19–32)
Calcium: 9.3 mg/dL (ref 8.4–10.5)
Chloride: 103 mEq/L (ref 96–112)
Creatinine, Ser: 1.02 mg/dL (ref 0.40–1.50)
GFR: 87.3 mL/min (ref >60.00)
Glucose, Bld: 71 mg/dL (ref 70–99)
Potassium: 4 mEq/L (ref 3.5–5.1)
Sodium: 138 mEq/L (ref 135–145)
Total Bilirubin: 0.9 mg/dL (ref 0.2–1.2)
Total Protein: 7.3 g/dL (ref 6.0–8.3)

## 2022-05-24 LAB — CBC WITH DIFFERENTIAL/PLATELET
Basophils Absolute: 0 10*3/uL (ref 0.0–0.1)
Basophils Relative: 0.5 % (ref 0.0–3.0)
Eosinophils Absolute: 0.1 10*3/uL (ref 0.0–0.7)
Eosinophils Relative: 1 % (ref 0.0–5.0)
HCT: 42.9 % (ref 39.0–52.0)
Hemoglobin: 14 g/dL (ref 13.0–17.0)
Lymphocytes Relative: 24.6 % (ref 12.0–46.0)
Lymphs Abs: 1.7 10*3/uL (ref 0.7–4.0)
MCHC: 32.6 g/dL (ref 30.0–36.0)
MCV: 86.7 fl (ref 78.0–100.0)
Monocytes Absolute: 0.8 10*3/uL (ref 0.1–1.0)
Monocytes Relative: 10.7 % (ref 3.0–12.0)
Neutro Abs: 4.5 10*3/uL (ref 1.4–7.7)
Neutrophils Relative %: 63.2 % (ref 43.0–77.0)
Platelets: 207 10*3/uL (ref 150.0–400.0)
RBC: 4.95 Mil/uL (ref 4.22–5.81)
RDW: 14.6 % (ref 11.5–15.5)
WBC: 7.1 10*3/uL (ref 4.0–10.5)

## 2022-05-24 LAB — TSH: TSH: 3.89 u[IU]/mL (ref 0.35–5.50)

## 2022-05-24 LAB — T4, FREE: Free T4: 0.57 ng/dL — ABNORMAL LOW (ref 0.60–1.60)

## 2022-05-24 LAB — VITAMIN D 25 HYDROXY (VIT D DEFICIENCY, FRACTURES): VITD: 22.6 ng/mL — ABNORMAL LOW (ref 30.00–100.00)
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
## 2022-05-25 ENCOUNTER — Telehealth: Payer: Self-pay | Admitting: Internal Medicine

## 2022-05-25 DIAGNOSIS — E069 Thyroiditis, unspecified: Secondary | ICD-10-CM | POA: Insufficient documentation

## 2022-05-25 LAB — T3: T3, Total: 102 ng/dL (ref 76–181)

## 2022-05-25 MED ORDER — METHIMAZOLE 5 MG PO TABS: 5.0000 mg | ORAL_TABLET | Freq: Every day | ORAL | 2 refills | Status: DC

## 2022-05-25 NOTE — Telephone Encounter (Signed)
Please let the pt know that its time to reduce methimazole from 10 mg to 5 mg daily    I have sent a new prescription of 5 mg tabs to be taken daily, in the meantime , he can cut the ones at home to half a tablet daily until done   Than go to the new 5 mg tabs    Let him know his vitamin d is low , which could be the reason why he has pain, he needs to start over the counter vitamin D 2000 iu daily   Thanks

## 2022-05-26 NOTE — Telephone Encounter (Signed)
Patient has been notified

## 2022-05-26 NOTE — Telephone Encounter (Signed)
Left  vm for patient to callback for result

## 2022-08-11 DIAGNOSIS — Z13228 Encounter for screening for other metabolic disorders: Secondary | ICD-10-CM | POA: Diagnosis not present

## 2022-08-11 DIAGNOSIS — Z13220 Encounter for screening for lipoid disorders: Secondary | ICD-10-CM | POA: Diagnosis not present

## 2022-08-11 DIAGNOSIS — Z125 Encounter for screening for malignant neoplasm of prostate: Secondary | ICD-10-CM | POA: Diagnosis not present

## 2022-08-11 DIAGNOSIS — R7989 Other specified abnormal findings of blood chemistry: Secondary | ICD-10-CM | POA: Diagnosis not present

## 2022-08-11 DIAGNOSIS — Z0000 Encounter for general adult medical examination without abnormal findings: Secondary | ICD-10-CM | POA: Diagnosis not present

## 2022-09-08 DIAGNOSIS — I10 Essential (primary) hypertension: Secondary | ICD-10-CM | POA: Diagnosis not present

## 2022-09-08 DIAGNOSIS — E0590 Thyrotoxicosis, unspecified without thyrotoxic crisis or storm: Secondary | ICD-10-CM | POA: Diagnosis not present

## 2022-09-08 DIAGNOSIS — R252 Cramp and spasm: Secondary | ICD-10-CM | POA: Diagnosis not present

## 2022-09-08 DIAGNOSIS — E663 Overweight: Secondary | ICD-10-CM | POA: Diagnosis not present

## 2022-10-25 DIAGNOSIS — Z20828 Contact with and (suspected) exposure to other viral communicable diseases: Secondary | ICD-10-CM | POA: Diagnosis not present

## 2022-10-25 DIAGNOSIS — J069 Acute upper respiratory infection, unspecified: Secondary | ICD-10-CM | POA: Diagnosis not present

## 2022-11-29 ENCOUNTER — Ambulatory Visit (INDEPENDENT_AMBULATORY_CARE_PROVIDER_SITE_OTHER): Payer: BC Managed Care – PPO | Admitting: Internal Medicine

## 2022-11-29 ENCOUNTER — Encounter: Payer: Self-pay | Admitting: Internal Medicine

## 2022-11-29 VITALS — BP 118/76 | HR 84 | Ht 73.0 in | Wt 283.0 lb

## 2022-11-29 DIAGNOSIS — E0500 Thyrotoxicosis with diffuse goiter without thyrotoxic crisis or storm: Secondary | ICD-10-CM | POA: Diagnosis not present

## 2022-11-29 DIAGNOSIS — E069 Thyroiditis, unspecified: Secondary | ICD-10-CM
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
## 2022-11-30 LAB — T4, FREE: Free T4: 0.94 ng/dL (ref 0.60–1.60)

## 2022-11-30 LAB — TSH: TSH: 0.45 u[IU]/mL (ref 0.35–5.50)

## 2022-12-01 ENCOUNTER — Encounter: Payer: Self-pay | Admitting: Internal Medicine

## 2022-12-01 MED ORDER — METHIMAZOLE 5 MG PO TABS
5.0000 mg | ORAL_TABLET | Freq: Every day | ORAL | 3 refills | Status: DC
Start: 2022-12-01 — End: 2023-11-06

## 2023-04-25 DIAGNOSIS — I10 Essential (primary) hypertension: Secondary | ICD-10-CM | POA: Diagnosis not present

## 2023-04-25 DIAGNOSIS — E0590 Thyrotoxicosis, unspecified without thyrotoxic crisis or storm: Secondary | ICD-10-CM | POA: Diagnosis not present

## 2023-04-25 DIAGNOSIS — R7989 Other specified abnormal findings of blood chemistry: Secondary | ICD-10-CM | POA: Diagnosis not present

## 2023-04-25 DIAGNOSIS — Z125 Encounter for screening for malignant neoplasm of prostate: Secondary | ICD-10-CM | POA: Diagnosis not present

## 2023-06-07 ENCOUNTER — Ambulatory Visit: Payer: BC Managed Care – PPO | Admitting: Internal Medicine

## 2023-10-31 DIAGNOSIS — R238 Other skin changes: Secondary | ICD-10-CM | POA: Diagnosis not present

## 2023-10-31 DIAGNOSIS — E0590 Thyrotoxicosis, unspecified without thyrotoxic crisis or storm: Secondary | ICD-10-CM | POA: Diagnosis not present

## 2023-10-31 DIAGNOSIS — I10 Essential (primary) hypertension: Secondary | ICD-10-CM | POA: Diagnosis not present

## 2023-11-03 ENCOUNTER — Encounter: Payer: Self-pay | Admitting: Internal Medicine

## 2023-11-03 ENCOUNTER — Ambulatory Visit (INDEPENDENT_AMBULATORY_CARE_PROVIDER_SITE_OTHER): Payer: BC Managed Care – PPO | Admitting: Internal Medicine

## 2023-11-03 VITALS — BP 122/74 | HR 54 | Ht 73.0 in | Wt 277.0 lb

## 2023-11-03 DIAGNOSIS — E0500 Thyrotoxicosis with diffuse goiter without thyrotoxic crisis or storm: Secondary | ICD-10-CM | POA: Diagnosis not present

## 2023-11-03 DIAGNOSIS — L819 Disorder of pigmentation, unspecified: Secondary | ICD-10-CM | POA: Insufficient documentation

## 2023-11-03 DIAGNOSIS — E069 Thyroiditis, unspecified: Secondary | ICD-10-CM
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
## 2023-11-04 LAB — T3, FREE: T3, Free: 3.2 pg/mL (ref 2.3–4.2)

## 2023-11-04 LAB — T4, FREE: Free T4: 1.1 ng/dL (ref 0.8–1.8)

## 2023-11-04 LAB — TSH: TSH: 0.36 m[IU]/L — ABNORMAL LOW (ref 0.40–4.50)

## 2023-11-06 MED ORDER — METHIMAZOLE 5 MG PO TABS: 5.0000 mg | ORAL_TABLET | ORAL | 3 refills | Status: DC

## 2024-02-05 ENCOUNTER — Telehealth: Payer: Self-pay

## 2024-02-05 NOTE — Telephone Encounter (Signed)
 Patient states that he is still experiencing discoloration of the leg and severe pain.

## 2024-02-05 NOTE — Telephone Encounter (Signed)
Patient aware and will contact PCP.

## 2024-05-03 ENCOUNTER — Encounter: Payer: Self-pay | Admitting: Internal Medicine

## 2024-05-03 ENCOUNTER — Ambulatory Visit (INDEPENDENT_AMBULATORY_CARE_PROVIDER_SITE_OTHER): Payer: BC Managed Care – PPO | Admitting: Internal Medicine

## 2024-05-03 VITALS — BP 126/80 | HR 65 | Ht 73.0 in | Wt 280.0 lb

## 2024-05-03 DIAGNOSIS — K429 Umbilical hernia without obstruction or gangrene: Secondary | ICD-10-CM

## 2024-05-03 DIAGNOSIS — E069 Thyroiditis, unspecified: Secondary | ICD-10-CM

## 2024-05-03 DIAGNOSIS — E0500 Thyrotoxicosis with diffuse goiter without thyrotoxic crisis or storm: Secondary | ICD-10-CM | POA: Diagnosis not present
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
## 2024-05-04 LAB — T4, FREE: Free T4: 1.4 ng/dL (ref 0.8–1.8)

## 2024-05-04 LAB — TSH: TSH: 0.01 m[IU]/L — ABNORMAL LOW (ref 0.40–4.50)

## 2024-05-06 ENCOUNTER — Ambulatory Visit: Payer: Self-pay | Admitting: Internal Medicine

## 2024-05-06 DIAGNOSIS — E0500 Thyrotoxicosis with diffuse goiter without thyrotoxic crisis or storm: Secondary | ICD-10-CM

## 2024-05-06 MED ORDER — METHIMAZOLE 10 MG PO TABS: 10.0000 mg | ORAL_TABLET | Freq: Every day | ORAL | 3 refills | Status: AC

## 2024-05-06 NOTE — Telephone Encounter (Signed)
 Please let the patient know that his thyroid  continues to be overactive, I have changed methimazole  from 5 mg tablets to 10 mg tablets, he will need to take 1 tablet today    Please schedule the patient for a lab appointment in 2 months   Thanks

## 2024-05-13 ENCOUNTER — Ambulatory Visit: Payer: Self-pay | Admitting: Nurse Practitioner

## 2024-05-13 ENCOUNTER — Telehealth: Payer: Self-pay

## 2024-05-13 ENCOUNTER — Ambulatory Visit: Payer: Self-pay

## 2024-05-13 NOTE — Telephone Encounter (Signed)
 Patient was advised to go to Urgent care or ER for blood in urine. Patient is establishing care here tomorrow. Patient was informed we would follow up tomorrow regarding his urgent care or ER visit.

## 2024-05-13 NOTE — Telephone Encounter (Signed)
 FYI Only or Action Required?: FYI only for provider.  Patient was last seen in primary care on 11/23/2021 by [facility], Donell Cardinal, MD. Called Nurse Triage reporting Hematuria. Symptoms began several days ago. Interventions attempted: Nothing. Symptoms are: unchanged.  Triage Disposition: See Physician Within 24 Hours  Patient/caregiver understands and will follow disposition?: Yes        Copied from CRM [id number]. Topic: Clinical - Red Word Triage >> May 13, 2024  2:22 PM Antwanette L wrote: Red Word that prompted transfer to Nurse Triage: possible hernia. Blood in urine Reason for Disposition  Pain or burning with passing urine  Answer Assessment - Initial Assessment Questions 1. COLOR of URINE: Describe the color of the urine.  (e.g., tea-colored, pink, red, bloody) Do you have blood clots in your urine? (e.g., none, pea, grape, small coin)     Pink-ish in color, very little  2. ONSET: When did the bleeding start?      Started 2 days ago  3. EPISODES: How many times has there been blood in the urine? or How many times today?     Pink in color every time he has urinated today  4. PAIN with URINATION: Is there any pain with passing your urine? If Yes, ask: How bad is the pain?  (Scale 1-10; or mild, moderate, severe)    - MILD: Complains slightly about urination hurting.    - MODERATE: Interferes with normal activities.      - SEVERE: Excruciating, unwilling or unable to urinate because of the pain.      Burning pain when urinating  5. FEVER: Do you have a fever? If Yes, ask: What is your temperature, how was it measured, and when did it start?     No  6. ASSOCIATED SYMPTOMS: Are you passing urine more frequently than usual?     No, but takes a urine pill  7. OTHER SYMPTOMS: Do you have any other symptoms? (e.g., back/flank pain, abdomen pain, vomiting)     Lower abd pain, was told by endocrinologist that he has a hernia  Protocols used: Urine  - Blood In-A-AH

## 2024-05-14 ENCOUNTER — Encounter: Payer: Self-pay | Admitting: Nurse Practitioner

## 2024-05-14 ENCOUNTER — Ambulatory Visit: Payer: Self-pay | Admitting: Nurse Practitioner

## 2024-05-14 VITALS — BP 110/60 | HR 54 | Temp 99.1°F | Ht 73.0 in | Wt 284.6 lb

## 2024-05-14 DIAGNOSIS — L819 Disorder of pigmentation, unspecified: Secondary | ICD-10-CM

## 2024-05-14 DIAGNOSIS — I10 Essential (primary) hypertension: Secondary | ICD-10-CM

## 2024-05-14 DIAGNOSIS — R202 Paresthesia of skin: Secondary | ICD-10-CM

## 2024-05-14 DIAGNOSIS — E0500 Thyrotoxicosis with diffuse goiter without thyrotoxic crisis or storm: Secondary | ICD-10-CM

## 2024-05-14 DIAGNOSIS — Z8679 Personal history of other diseases of the circulatory system: Secondary | ICD-10-CM

## 2024-05-14 DIAGNOSIS — Z7689 Persons encountering health services in other specified circumstances: Secondary | ICD-10-CM

## 2024-05-14 DIAGNOSIS — Z6837 Body mass index (BMI) 37.0-37.9, adult: Secondary | ICD-10-CM

## 2024-05-14 DIAGNOSIS — Z8042 Family history of malignant neoplasm of prostate: Secondary | ICD-10-CM

## 2024-05-14 DIAGNOSIS — R200 Anesthesia of skin: Secondary | ICD-10-CM

## 2024-05-14 DIAGNOSIS — E66812 Obesity, class 2: Secondary | ICD-10-CM

## 2024-05-14 DIAGNOSIS — Z1211 Encounter for screening for malignant neoplasm of colon: Secondary | ICD-10-CM

## 2024-05-14 DIAGNOSIS — Z79899 Other long term (current) drug therapy: Secondary | ICD-10-CM

## 2024-05-14 DIAGNOSIS — E6609 Other obesity due to excess calories: Secondary | ICD-10-CM

## 2024-05-14 DIAGNOSIS — R319 Hematuria, unspecified: Secondary | ICD-10-CM | POA: Diagnosis not present

## 2024-05-14 DIAGNOSIS — Z2821 Immunization not carried out because of patient refusal: Secondary | ICD-10-CM

## 2024-05-14 LAB — POCT URINALYSIS DIP (CLINITEK)
Bilirubin, UA: NEGATIVE
Blood, UA: NEGATIVE
Glucose, UA: NEGATIVE mg/dL
Ketones, POC UA: NEGATIVE mg/dL
Leukocytes, UA: NEGATIVE
Nitrite, UA: NEGATIVE
POC PROTEIN,UA: NEGATIVE
Spec Grav, UA: >=1.03 — AB (ref 1.010–1.025)
Urobilinogen, UA: 1 U/dL
pH, UA: 6.5 (ref 5.0–8.0)

## 2024-05-14 NOTE — Patient Instructions (Signed)
 You will receive a call from [facility] for the ultrasound of your legs within the next week.

## 2024-05-14 NOTE — Progress Notes (Signed)
 Troy Morris, CMA,acting as a Neurosurgeon for Troy Ada, FNP.,have documented all relevant documentation on the behalf of Troy Ada, FNP,as directed by  Troy Ada, FNP while in the presence of Troy Ada, FNP.  Subjective:  Patient ID: Troy Morris , male    DOB: 23-Feb-1976 , 47 y.o.   MRN: [id number]  Chief Complaint  Patient presents with   Establish Care    Patient presents today to establish care, Patient reports compliance with medication. Patient denies any chest pain, SOB, or headaches.   Hematuria    Patient reports yesterday he had blood in his urine and burning when urinating he noticed it about 5 times. He reports Sunday he noticed his his boxers were discolored a little. He reports today he hasn't noticed any blood but he is still having burning.  He belives he has a hernia.    Tingling    Patient reports he has tingling in both legs along with some discoloration in his lower legs.    Graves' Disease    He reports he has graves disease and Hyperthyroiditis he is followed by endo.     HPI  He seen Dr. Ilah about 3-4 months ago. He had labs done at that time. He currently works in [employer]. Married. He has 3 children - 39 son, 70 son, and 53 y/o son who still lives at home.   [name] is a male patient with a history of congestive heart failure diagnosed around 2015-2016 and hyperthyroid/Graves' disease diagnosed in 2007. He reports experiencing tingling and numbness in both legs down to his toes for an unspecified duration.  The patient was recently seen by Dr. Sam for his thyroid  condition, resulting in an increase in his medication dosage.  CHF - since 2016 - he used to see Dr. Ladona -  Hyperthyroid (graves disease) - Dr. Wilhemina - 2007 - he had an increase in the dose of the methimazole . She also seen a small bulge to his abdomen  He is having numbness and tingling to his bilateral legs radiating to his toes.  While at the track meet on Sunday and when he  urinated yesterday he had blood in his urine. This is the first time this has occurred. He was also having a burning sensation when urinating.   His mother has a history of breast cancer and his brother has recently been diagnosed with prostate cancer.       Past Medical History:  Diagnosis Date   Acute diastolic CHF (congestive heart failure) (HCC)    CHF (congestive heart failure) (HCC) 03/21/2016   Fluid overload 03/22/2016   LV dysfunction 03/23/2016   Thyroid  disease      Family History  Problem Relation Age of Onset   Cancer Mother    Diabetes Mother    Cancer Brother    CAD Neg Hx    Stroke Neg Hx    Clotting disorder Neg Hx      Current Outpatient Medications:    ibuprofen (ADVIL,MOTRIN) 200 MG tablet, Take 200 mg by mouth every 6 (six) hours as needed for headache., Disp: , Rfl:    methimazole  (TAPAZOLE ) 10 MG tablet, Take 1 tablet (10 mg total) by mouth daily., Disp: 90 tablet, Rfl: 3   metoprolol  succinate (TOPROL -XL) 50 MG 24 hr tablet, Take 50 mg by mouth daily., Disp: , Rfl:    ramipril  (ALTACE ) 2.5 MG capsule, Take 1 capsule (2.5 mg total) by mouth daily., Disp: 30 capsule, Rfl: 2  triamterene -hydrochlorothiazide (MAXZIDE-25) 37.5-25 MG tablet, Take 0.5 tablets by mouth daily., Disp: 15 tablet, Rfl: 3   metoprolol  tartrate (LOPRESSOR ) 50 MG tablet, Take 1 tablet (50 mg total) by mouth 2 (two) times daily. (Patient not taking: Reported on 05/14/2024), Disp: 60 tablet, Rfl: 3   zinc gluconate 50 MG tablet, Take 50 mg by mouth daily. (Patient not taking: Reported on 05/14/2024), Disp: , Rfl:    No Known Allergies   Review of Systems  Constitutional: Negative.   Respiratory: Negative.    Cardiovascular: Negative.   Genitourinary: Negative.   Musculoskeletal: Negative.   Skin: Negative.   Neurological:  Positive for numbness (tingling).  Hematological: Negative.   Psychiatric/Behavioral: Negative.       Today's Vitals   05/14/24 1431  BP: 110/60  Pulse: (!) 54   Temp: 99.1 F (37.3 C)  TempSrc: Oral  Weight: 284 lb 9.6 oz (129.1 kg)  Height: 6' 1 (1.854 m)  PainSc: 0-No pain   Body mass index is 37.55 kg/m.  Wt Readings from Last 3 Encounters:  05/14/24 284 lb 9.6 oz (129.1 kg)  05/03/24 280 lb (127 kg)  11/03/23 277 lb (125.6 kg)      Objective:  Physical Exam Vitals and nursing note reviewed.  Constitutional:      General: He is not in acute distress.    Appearance: Normal appearance. He is obese.  HENT:     Head: Normocephalic.  Cardiovascular:     Rate and Rhythm: Normal rate and regular rhythm.     Pulses: Normal pulses.     Heart sounds: Normal heart sounds. No murmur heard. Pulmonary:     Effort: Pulmonary effort is normal. No respiratory distress.     Breath sounds: Normal breath sounds. No wheezing.  Abdominal:     General: Abdomen is flat.     Palpations: Abdomen is soft.     Hernia: A hernia is present.  Musculoskeletal:        General: Normal range of motion.  Skin:    General: Skin is warm and dry.     Capillary Refill: Capillary refill takes less than 2 seconds.  Neurological:     General: No focal deficit present.     Mental Status: He is alert and oriented to person, place, and time.  Psychiatric:        Mood and Affect: Mood normal.        Behavior: Behavior normal.        Thought Content: Thought content normal.        Judgment: Judgment normal.         Assessment And Plan:  Establishing care with new doctor, encounter for Assessment & Plan: Patient is here to establish care. Went over patient medical, family, social and surgical history. Reviewed with patient their medications and any allergies  Reviewed with patient their sexual orientation, drug/tobacco and alcohol use Dicussed any new concerns with patient  recommended patient comes in for a physical exam and complete blood work.  Educated patient about the importance of annual screenings and immunizations.  Advised patient to eat a  healthy diet along with exercise for atleast 30-45 min at least 4-5 days of the week.     Benign essential HTN Assessment & Plan: Blood pressure is well controlled, continue current medications  Orders: -     CBC with Differential/Platelet -     BMP8+eGFR  Hyperpigmentation -     VAS US  LOWER EXTREMITY VENOUS REFLUX; Future  Hematuria,  unspecified type Assessment & Plan: Will check urinalysis today, he has not seen any blood in urine.   Orders: -     POCT URINALYSIS DIP (CLINITEK)  History of atrial fibrillation in the setting of hyperthyroidism 03/21/16 Assessment & Plan: He is being followed by Endocrinology   Screening for colon cancer Assessment & Plan: According to USPTF Colorectal cancer Screening guidelines. Colonoscopy is recommended every 10 years, starting at age 65 years. Will refer to GI for colon cancer screening.   Orders: -     Ambulatory referral to Gastroenterology  COVID-19 vaccination declined Assessment & Plan: Declines covid 19 vaccine. Discussed risk of covid 17 and if he changes her mind about the vaccine to call the office. Education has been provided regarding the importance of this vaccine but patient still declined. Advised may receive this vaccine at local pharmacy or Health Dept.or vaccine clinic. Aware to provide a copy of the vaccination record if obtained from local pharmacy or Health Dept.  Encouraged to take multivitamin, vitamin d , vitamin c and zinc to increase immune system. Aware can call office if would like to have vaccine here at office. Verbalized acceptance and understanding.    Class 2 obesity due to excess calories with body mass index (BMI) of 37.0 to 37.9 in adult, unspecified whether serious comorbidity present Assessment & Plan: He is encouraged to strive for BMI less than 30 to decrease cardiac risk. Advised to aim for at least 150 minutes of exercise per week.    Graves disease Assessment & Plan: Continue f/u with  Endocrinology   Numbness and tingling of lower extremity -     VAS US  LOWER EXTREMITY VENOUS REFLUX; Future -     Vitamin B12 -     VITAMIN D  25 Hydroxy (Vit-D Deficiency, Fractures) -     Methylmalonic acid, serum  Family history of prostate cancer -     PSA  Other long term (current) drug therapy -     CBC with Differential/Platelet    Return in about 4 months (around 09/14/2024) for phy when able; 6-8 week f/u .  Patient was given opportunity to ask questions. Patient verbalized understanding of the plan and was able to repeat key elements of the plan. All questions were answered to their satisfaction.    Troy Troy Ada, FNP, have reviewed all documentation for this visit. The documentation on 05/14/24 for the exam, diagnosis, procedures, and orders are all accurate and complete.   IF YOU HAVE BEEN REFERRED TO A SPECIALIST, IT MAY TAKE 1-2 WEEKS TO SCHEDULE/PROCESS THE REFERRAL. IF YOU HAVE NOT HEARD FROM US /SPECIALIST IN TWO WEEKS, PLEASE GIVE US  A CALL AT [phone].

## 2024-05-17 LAB — CBC WITH DIFFERENTIAL/PLATELET
Basophils Absolute: 0 x10E3/uL (ref 0.0–0.2)
Basos: 0 %
EOS (ABSOLUTE): 0.1 x10E3/uL (ref 0.0–0.4)
Eos: 1 %
Hematocrit: 45.8 % (ref 37.5–51.0)
Hemoglobin: 14.6 g/dL (ref 13.0–17.7)
Immature Grans (Abs): 0 x10E3/uL (ref 0.0–0.1)
Immature Granulocytes: 0 %
Lymphocytes Absolute: 2 x10E3/uL (ref 0.7–3.1)
Lymphs: 29 %
MCH: 27.8 pg (ref 26.6–33.0)
MCHC: 31.9 g/dL (ref 31.5–35.7)
MCV: 87 fL (ref 79–97)
Monocytes Absolute: 0.7 x10E3/uL (ref 0.1–0.9)
Monocytes: 10 %
Neutrophils Absolute: 4.1 x10E3/uL (ref 1.4–7.0)
Neutrophils: 60 %
Platelets: 232 x10E3/uL (ref 150–450)
RBC: 5.26 x10E6/uL (ref 4.14–5.80)
RDW: 13.4 % (ref 11.6–15.4)
WBC: 6.9 x10E3/uL (ref 3.4–10.8)

## 2024-05-17 LAB — VITAMIN D 25 HYDROXY (VIT D DEFICIENCY, FRACTURES): Vit D, 25-Hydroxy: 26.1 ng/mL — ABNORMAL LOW (ref 30.0–100.0)

## 2024-05-17 LAB — BMP8+EGFR
BUN/Creatinine Ratio: 15 (ref 9–20)
BUN: 13 mg/dL (ref 6–24)
CO2: 23 mmol/L (ref 20–29)
Calcium: 8.8 mg/dL (ref 8.7–10.2)
Chloride: 103 mmol/L (ref 96–106)
Creatinine, Ser: 0.89 mg/dL (ref 0.76–1.27)
Glucose: 63 mg/dL — ABNORMAL LOW (ref 70–99)
Potassium: 4.1 mmol/L (ref 3.5–5.2)
Sodium: 140 mmol/L (ref 134–144)
eGFR: 105 mL/min/1.73 (ref >59)

## 2024-05-17 LAB — PSA: Prostate Specific Ag, Serum: 1.1 ng/mL (ref 0.0–4.0)

## 2024-05-17 LAB — VITAMIN B12: Vitamin B-12: 456 pg/mL (ref 232–1245)

## 2024-05-17 LAB — METHYLMALONIC ACID, SERUM: Methylmalonic Acid: 130 nmol/L (ref 0–378)

## 2024-05-26 ENCOUNTER — Ambulatory Visit: Payer: Self-pay | Admitting: Nurse Practitioner

## 2024-05-26 DIAGNOSIS — Z2821 Immunization not carried out because of patient refusal: Secondary | ICD-10-CM | POA: Insufficient documentation

## 2024-05-26 DIAGNOSIS — Z1211 Encounter for screening for malignant neoplasm of colon: Secondary | ICD-10-CM | POA: Insufficient documentation

## 2024-05-26 DIAGNOSIS — Z7689 Persons encountering health services in other specified circumstances: Secondary | ICD-10-CM | POA: Insufficient documentation

## 2024-05-26 DIAGNOSIS — E66812 Obesity, class 2: Secondary | ICD-10-CM | POA: Insufficient documentation

## 2024-05-26 DIAGNOSIS — R319 Hematuria, unspecified: Secondary | ICD-10-CM | POA: Insufficient documentation

## 2024-05-26 DIAGNOSIS — R200 Anesthesia of skin: Secondary | ICD-10-CM | POA: Insufficient documentation

## 2024-05-26 MED ORDER — VITAMIN D (ERGOCALCIFEROL) 1.25 MG (50000 UNIT) PO CAPS: 50000.0000 [IU] | ORAL_CAPSULE | ORAL | 1 refills | Status: DC

## 2024-05-26 NOTE — Assessment & Plan Note (Signed)
 Will check urinalysis today, he has not seen any blood in urine.

## 2024-05-26 NOTE — Assessment & Plan Note (Signed)

## 2024-05-26 NOTE — Assessment & Plan Note (Signed)
 He is encouraged to strive for BMI less than 30 to decrease cardiac risk. Advised to aim for at least 150 minutes of exercise per week.

## 2024-05-26 NOTE — Assessment & Plan Note (Signed)

## 2024-05-26 NOTE — Assessment & Plan Note (Signed)
Will check for metabolic causes.

## 2024-05-26 NOTE — Assessment & Plan Note (Signed)
 Blood pressure is well controlled, continue current medications.

## 2024-05-26 NOTE — Assessment & Plan Note (Signed)
Continue f/u with Endocrinology.

## 2024-05-26 NOTE — Assessment & Plan Note (Signed)
 He is being followed by Endocrinology

## 2024-05-26 NOTE — Assessment & Plan Note (Signed)
 According to USPTF Colorectal cancer Screening guidelines. Colonoscopy is recommended every 10 years, starting at age 50 years. Will refer to GI for colon cancer screening.

## 2024-06-03 ENCOUNTER — Ambulatory Visit (HOSPITAL_COMMUNITY)
Admission: RE | Admit: 2024-06-03 | Discharge: 2024-06-03 | Disposition: A | Discharge status: Home / Self Care | Source: Ambulatory Visit | Attending: Nurse Practitioner | Admitting: Nurse Practitioner

## 2024-06-03 DIAGNOSIS — R200 Anesthesia of skin: Secondary | ICD-10-CM | POA: Insufficient documentation

## 2024-06-03 DIAGNOSIS — L819 Disorder of pigmentation, unspecified: Secondary | ICD-10-CM | POA: Insufficient documentation

## 2024-06-03 DIAGNOSIS — R202 Paresthesia of skin: Secondary | ICD-10-CM | POA: Diagnosis not present

## 2024-06-27 ENCOUNTER — Other Ambulatory Visit: Payer: Self-pay | Admitting: Nurse Practitioner

## 2024-06-27 NOTE — Telephone Encounter (Signed)
 Copied from CRM [id number]. Topic: Clinical - Medication Refill >> Jun 27, 2024 11:26 AM Marissa P wrote: Patient went to pharmacy for the refill showed denied due to provider and needs to be verified by current prescriber for metoprolol succinate (TOPROL-XL) 50 MG 24 hr tablet   Medication: metoprolol succinate (TOPROL-XL) 50 MG 24 hr tablet   Has the patient contacted their pharmacy? Yes (Agent: If no, request that the patient contact the pharmacy for the refill. If patient does not wish to contact the pharmacy document the reason why and proceed with request.) (Agent: If yes, when and what did the pharmacy advise?)  This is the patient's preferred pharmacy:  [facility] [id number] [place], [place] - [address] [place] [address] Phone: [phone] Fax: [phone]  Is this the correct pharmacy for this prescription? Yes If no, delete pharmacy and type the correct one.   Has the prescription been filled recently? Yes   Is the patient out of the medication? YES  Has the patient been seen for an appointment in the last year OR does the patient have an upcoming appointment? Yes  Can we respond through MyChart? Yes  Agent: Please be advised that Rx refills may take up to 3 business days. We ask that you follow-up with your pharmacy.

## 2024-07-03 ENCOUNTER — Other Ambulatory Visit: Payer: Self-pay

## 2024-07-03 MED ORDER — METOPROLOL SUCCINATE ER 50 MG PO TB24: 50.0000 mg | ORAL_TABLET | Freq: Every day | ORAL | 1 refills | Status: DC

## 2024-07-04 ENCOUNTER — Other Ambulatory Visit

## 2024-07-04 DIAGNOSIS — E0500 Thyrotoxicosis with diffuse goiter without thyrotoxic crisis or storm: Secondary | ICD-10-CM | POA: Diagnosis not present

## 2024-07-04 LAB — T3, FREE: T3, Free: 4.2 pg/mL (ref 2.3–4.2)

## 2024-07-04 LAB — T4, FREE: Free T4: 1.3 ng/dL (ref 0.8–1.8)

## 2024-07-04 LAB — TSH: TSH: 0.04 m[IU]/L — ABNORMAL LOW (ref 0.40–4.50)

## 2024-07-08 ENCOUNTER — Ambulatory Visit: Payer: Self-pay

## 2024-07-08 MED ORDER — METOPROLOL TARTRATE 50 MG PO TABS: 50.0000 mg | ORAL_TABLET | Freq: Two times a day (BID) | ORAL | 3 refills | Status: DC

## 2024-07-08 NOTE — Telephone Encounter (Signed)
 FYI Only or Action Required?: FYI only for provider.  Patient was last seen in primary care on 05/14/2024 by Troy Speaks, FNP.  Called Nurse Triage reporting Hematuria.  Symptoms began yesterday.  Interventions attempted: Nothing.  Symptoms are: unchanged.  Triage Disposition: See Physician Within 24 Hours  Patient/caregiver understands and will follow disposition?: Yes        Copied from CRM #[id number]. Topic: Clinical - Red Word Triage >> Jul 08, 2024 11:47 AM Myrick T wrote: [facility] that prompted transfer to Nurse Triage: patient called stated he has blood in his urine and pain in the middle of his abdomen when he urinate         Reason for Disposition  Pain or burning with passing urine  Answer Assessment - Initial Assessment Questions 1. COLOR of URINE: Describe the color of the urine.  (e.g., tea-colored, pink, red, bloody) Do you have blood clots in your urine? (e.g., none, pea, grape, small coin)     Brownish red  2. ONSET: When did the bleeding start?      Yesterday  3. EPISODES: How many times has there been blood in the urine? or How many times today?     2-3 times since yesterday  4. PAIN with URINATION: Is there any pain with passing your urine? If Yes, ask: How bad is the pain?  (Scale 1-10; or mild, moderate, severe)     Mild to moderate  5. FEVER: Do you have a fever? If Yes, ask: What is your temperature, how was it measured, and when did it start?     No 6. ASSOCIATED SYMPTOMS: Are you passing urine more frequently than usual?     No 7. OTHER SYMPTOMS: Do you have any other symptoms? (e.g., back/flank pain, abdomen pain, vomiting)     Abdominal pain with urinating  Protocols used: Urine - Blood In-A-AH

## 2024-07-09 ENCOUNTER — Encounter: Payer: Self-pay | Admitting: Nurse Practitioner

## 2024-07-09 ENCOUNTER — Ambulatory Visit: Payer: Self-pay | Admitting: Internal Medicine

## 2024-07-09 ENCOUNTER — Ambulatory Visit (INDEPENDENT_AMBULATORY_CARE_PROVIDER_SITE_OTHER): Payer: Self-pay | Admitting: Nurse Practitioner

## 2024-07-09 VITALS — BP 122/80 | HR 52 | Temp 98.3°F | Ht 73.0 in | Wt 283.4 lb

## 2024-07-09 DIAGNOSIS — R300 Dysuria: Secondary | ICD-10-CM | POA: Diagnosis not present

## 2024-07-09 DIAGNOSIS — R1030 Lower abdominal pain, unspecified: Secondary | ICD-10-CM

## 2024-07-09 DIAGNOSIS — R002 Palpitations: Secondary | ICD-10-CM

## 2024-07-09 DIAGNOSIS — I110 Hypertensive heart disease with heart failure: Secondary | ICD-10-CM

## 2024-07-09 DIAGNOSIS — R319 Hematuria, unspecified: Secondary | ICD-10-CM

## 2024-07-09 DIAGNOSIS — E6609 Other obesity due to excess calories: Secondary | ICD-10-CM

## 2024-07-09 DIAGNOSIS — Z6837 Body mass index (BMI) 37.0-37.9, adult: Secondary | ICD-10-CM

## 2024-07-09 DIAGNOSIS — I509 Heart failure, unspecified: Secondary | ICD-10-CM

## 2024-07-09 DIAGNOSIS — I10 Essential (primary) hypertension: Secondary | ICD-10-CM

## 2024-07-09 DIAGNOSIS — E66812 Obesity, class 2: Secondary | ICD-10-CM

## 2024-07-09 LAB — POCT URINALYSIS DIP (CLINITEK)
Bilirubin, UA: NEGATIVE
Glucose, UA: NEGATIVE mg/dL
Ketones, POC UA: NEGATIVE mg/dL
Leukocytes, UA: NEGATIVE
Nitrite, UA: NEGATIVE
POC PROTEIN,UA: NEGATIVE
Spec Grav, UA: 1.015 (ref 1.010–1.025)
Urobilinogen, UA: 0.2 U/dL
pH, UA: 7 (ref 5.0–8.0)

## 2024-07-09 MED ORDER — VITAMIN D (ERGOCALCIFEROL) 1.25 MG (50000 UNIT) PO CAPS: 50000.0000 [IU] | ORAL_CAPSULE | ORAL | 1 refills | Status: AC

## 2024-07-09 MED ORDER — RAMIPRIL 2.5 MG PO CAPS: 2.5000 mg | ORAL_CAPSULE | Freq: Every day | ORAL | 2 refills | Status: DC

## 2024-07-09 NOTE — Progress Notes (Signed)
 LILLETTE Kristeen JINNY Gladis, CMA,acting as a Neurosurgeon for [name], FNP.,have documented all relevant documentation on the behalf of [name], FNP,as directed by  [name], FNP while in the presence of [name], FNP.  Subjective:  Patient ID: Troy Morris , male    DOB: [birth year] , 48 y.o.   MRN: [id number]  Chief Complaint  Patient presents with   Hematuria    Patient reports he had blood in his urine yesterday, patient reports he has a little stinging as well. Patient reports he was out in the sun Saturday and could have gotten dehydrated. Patient reports it was one time yesterday.    Palpitations    Patient reports he sometimes has palpitations about once every other day. He reports having a heart murmur as a child.      HPI  Discussed the use of AI scribe software for clinical note transcription with the patient, who gave verbal consent to proceed.  History of Present Illness Troy Morris is a 48 year old male who presents with blood in his urine.  He has been experiencing hematuria, first noticed after sun exposure. The hematuria is less obvious this time, but he experiences dysuria, with pain most intense when holding urine and alleviating upon urination. He drinks two to three bottles of water daily, with variability in fluid intake.  He has a history of congestive heart failure diagnosed in 2016 after an episode at work led to urgent care and hospitalization. He has not seen his cardiologist in over three years. He experiences occasional heart palpitations and has a history of a heart murmur since childhood. He is currently on metoprolol  50 mg extended release once daily, with a previous higher dose of 200 mg. He is unsure about a recent change to 100 mg, possibly suggested by his thyroid  doctor.  He has a history of hyperthyroidism diagnosed in 2007. He is on vitamin D  supplementation, which he lost during a trip and has not used any refills since July.  He experiences  abdominal pain and has a history of a hernia, which he refers to as a 'split wall'. He had an ultrasound in 2017 and is unsure about any bacterial infection related to this condition.  His family history includes a brother diagnosed with cancer. He is mindful of his fluid intake due to his heart condition.   Past Medical History:  Diagnosis Date   Acute diastolic CHF (congestive heart failure) (HCC)    CHF (congestive heart failure) (HCC) 03/21/2016   Fluid overload 03/22/2016   LV dysfunction 03/23/2016   Thyroid  disease      Family History  Problem Relation Age of Onset   Cancer Mother    Diabetes Mother    Cancer Brother    CAD Neg Hx    Stroke Neg Hx    Clotting disorder Neg Hx      Current Outpatient Medications:    ibuprofen (ADVIL,MOTRIN) 200 MG tablet, Take 200 mg by mouth every 6 (six) hours as needed for headache., Disp: , Rfl:    methimazole  (TAPAZOLE ) 10 MG tablet, Take 1 tablet (10 mg total) by mouth daily., Disp: 90 tablet, Rfl: 3   metoprolol  succinate (TOPROL -XL) 50 MG 24 hr tablet, Take 1 tablet (50 mg total) by mouth daily., Disp: 60 tablet, Rfl: 1   triamterene -hydrochlorothiazide (MAXZIDE-25) 37.5-25 MG tablet, Take 0.5 tablets by mouth daily., Disp: 15 tablet, Rfl: 3   ramipril  (ALTACE ) 2.5 MG capsule, Take 1 capsule (2.5 mg total)  by mouth daily., Disp: 30 capsule, Rfl: 2   Vitamin D , Ergocalciferol , (DRISDOL ) 1.25 MG (50000 UNIT) CAPS capsule, Take 1 capsule (50,000 Units total) by mouth every 7 (seven) days., Disp: 12 capsule, Rfl: 1   zinc gluconate 50 MG tablet, Take 50 mg by mouth daily. (Patient not taking: Reported on 07/09/2024), Disp: , Rfl:    No Known Allergies   Review of Systems  Constitutional: Negative.   Respiratory: Negative.    Cardiovascular: Negative.   Gastrointestinal:  Positive for abdominal pain.  Musculoskeletal: Negative.   Neurological: Negative.   Psychiatric/Behavioral: Negative.       Today's Vitals   07/09/24 1206  BP:  122/80  Pulse: (!) 52  Temp: 98.3 F (36.8 C)  TempSrc: Oral  Weight: 283 lb 6.4 oz (128.5 kg)  Height: 6' 1 (1.854 m)  PainSc: 0-No pain   Body mass index is 37.39 kg/m.  Wt Readings from Last 3 Encounters:  07/09/24 283 lb 6.4 oz (128.5 kg)  05/14/24 284 lb 9.6 oz (129.1 kg)  05/03/24 280 lb (127 kg)      Objective:  Physical Exam Vitals and nursing note reviewed.  Constitutional:      General: He is not in acute distress.    Appearance: Normal appearance. He is obese.  HENT:     Head: Normocephalic.  Cardiovascular:     Rate and Rhythm: Normal rate and regular rhythm.     Pulses: Normal pulses.     Heart sounds: Normal heart sounds. No murmur heard. Pulmonary:     Effort: Pulmonary effort is normal. No respiratory distress.     Breath sounds: Normal breath sounds. No wheezing.  Abdominal:     General: Abdomen is flat.     Palpations: Abdomen is soft.     Hernia: A hernia is present.  Musculoskeletal:        General: Normal range of motion.  Skin:    General: Skin is warm and dry.     Capillary Refill: Capillary refill takes less than 2 seconds.  Neurological:     General: No focal deficit present.     Mental Status: He is alert and oriented to person, place, and time.  Psychiatric:        Mood and Affect: Mood normal.        Behavior: Behavior normal.        Thought Content: Thought content normal.        Judgment: Judgment normal.      Assessment And Plan:  Hematuria, unspecified type Assessment & Plan: Recurrent hematuria with dysuria and lower abdominal pain. Trace blood in urine. Differential includes UTI and structural urinary tract issues. Dehydration considered. - Order urine culture for UTI. - Screen for STDs. - Refer to urologist if urine culture is negative. - Advise emergency care if gross hematuria occurs.  Orders: -     POCT URINALYSIS DIP (CLINITEK) -     Chlamydia/Gonococcus/Trichomonas, NAA -     Urine Culture -     CT ABDOMEN  PELVIS WO CONTRAST; Future  Dysuria -     POCT URINALYSIS DIP (CLINITEK) -     Chlamydia/Gonococcus/Trichomonas, NAA -     CT ABDOMEN PELVIS WO CONTRAST; Future  Palpitation -     EKG 12-Lead -     Ambulatory referral to Cardiology  Chronic congestive heart failure, unspecified heart failure type [facility]) Assessment & Plan: CHF since 2016, initially with hyperthyroidism. Fluid management crucial due to CHF and diuretics.  Discussed fluid intake balance to avoid overhydration. - Refer to cardiologist Dr. Ladona for re-evaluation. - Advise 64 ounces of water daily, confirm with cardiologist. - Caution against overhydration.  Orders: -     Ambulatory referral to Cardiology  Lower abdominal pain Assessment & Plan: Abdominal pain with exam suggesting hernia. Previous ultrasound in 2017. - Order CT scan of abdomen. - Refer to surgeon if hernia is significant. - Advise weight loss and avoid straining during bowel movements.  Orders: -     CT ABDOMEN PELVIS WO CONTRAST; Future  Class 2 obesity due to excess calories with body mass index (BMI) of 37.0 to 37.9 in adult, unspecified whether serious comorbidity present  Benign essential HTN Assessment & Plan: Blood pressure controlled with metoprolol . Occasional dizziness possibly from low heart rate due to metoprolol . Discussed potential metoprolol  increase by thyroid  doctor, but BP stable. - Confirm metoprolol  increase reason with thyroid  doctor. - Advise against increasing metoprolol  without cardiologist evaluation. - Monitor for dizziness or lightheadedness.   Other orders -     Vitamin D  (Ergocalciferol ); Take 1 capsule (50,000 Units total) by mouth every 7 (seven) days.  Dispense: 12 capsule; Refill: 1 -     Ramipril ; Take 1 capsule (2.5 mg total) by mouth daily.  Dispense: 30 capsule; Refill: 2    Return for keep same next.  Patient was given opportunity to ask questions. Patient verbalized understanding of the plan and was able  to repeat key elements of the plan. All questions were answered to their satisfaction.    LILLETTE [name], FNP, have reviewed all documentation for this visit. The documentation on 07/09/24 for the exam, diagnosis, procedures, and orders are all accurate and complete.   IF YOU HAVE BEEN REFERRED TO A SPECIALIST, IT MAY TAKE 1-2 WEEKS TO SCHEDULE/PROCESS THE REFERRAL. IF YOU HAVE NOT HEARD FROM US /SPECIALIST IN TWO WEEKS, PLEASE GIVE US  A CALL AT [phone].

## 2024-07-11 LAB — URINE CULTURE

## 2024-07-11 LAB — CHLAMYDIA/GONOCOCCUS/TRICHOMONAS, NAA
Chlamydia by NAA: NEGATIVE
Gonococcus by NAA: NEGATIVE
Trich vag by NAA: NEGATIVE

## 2024-07-12 ENCOUNTER — Ambulatory Visit: Payer: Self-pay | Admitting: Nurse Practitioner

## 2024-07-16 ENCOUNTER — Encounter: Payer: Self-pay | Admitting: Internal Medicine

## 2024-07-17 DIAGNOSIS — R1030 Lower abdominal pain, unspecified: Secondary | ICD-10-CM | POA: Insufficient documentation

## 2024-07-17 DIAGNOSIS — I509 Heart failure, unspecified: Secondary | ICD-10-CM | POA: Insufficient documentation

## 2024-07-17 DIAGNOSIS — R300 Dysuria: Secondary | ICD-10-CM | POA: Insufficient documentation

## 2024-07-17 DIAGNOSIS — R002 Palpitations: Secondary | ICD-10-CM | POA: Insufficient documentation

## 2024-07-17 NOTE — Assessment & Plan Note (Signed)
 Abdominal pain with exam suggesting hernia. Previous ultrasound in 2017. - Order CT scan of abdomen. - Refer to surgeon if hernia is significant. - Advise weight loss and avoid straining during bowel movements.

## 2024-07-17 NOTE — Assessment & Plan Note (Signed)
 Recurrent hematuria with dysuria and lower abdominal pain. Trace blood in urine. Differential includes UTI and structural urinary tract issues. Dehydration considered. - Order urine culture for UTI. - Screen for STDs. - Refer to urologist if urine culture is negative. - Advise emergency care if gross hematuria occurs.

## 2024-07-17 NOTE — Assessment & Plan Note (Signed)
 CHF since 2016, initially with hyperthyroidism. Fluid management crucial due to CHF and diuretics. Discussed fluid intake balance to avoid overhydration. - Refer to cardiologist Dr. Ladona for re-evaluation. - Advise 64 ounces of water daily, confirm with cardiologist. - Caution against overhydration.

## 2024-07-17 NOTE — Assessment & Plan Note (Signed)
 Blood pressure controlled with metoprolol . Occasional dizziness possibly from low heart rate due to metoprolol . Discussed potential metoprolol  increase by thyroid  doctor, but BP stable. - Confirm metoprolol  increase reason with thyroid  doctor. - Advise against increasing metoprolol  without cardiologist evaluation. - Monitor for dizziness or lightheadedness.

## 2024-07-29 ENCOUNTER — Ambulatory Visit
Admission: RE | Admit: 2024-07-29 | Discharge: 2024-07-29 | Disposition: A | Discharge status: Home / Self Care | Source: Ambulatory Visit | Attending: Nurse Practitioner | Admitting: Nurse Practitioner

## 2024-07-29 DIAGNOSIS — R300 Dysuria: Secondary | ICD-10-CM

## 2024-07-29 DIAGNOSIS — R1030 Lower abdominal pain, unspecified: Secondary | ICD-10-CM | POA: Diagnosis not present

## 2024-07-29 DIAGNOSIS — R319 Hematuria, unspecified: Secondary | ICD-10-CM

## 2024-08-05 ENCOUNTER — Other Ambulatory Visit: Payer: Self-pay

## 2024-08-05 ENCOUNTER — Ambulatory Visit (AMBULATORY_SURGERY_CENTER)

## 2024-08-05 ENCOUNTER — Encounter: Payer: Self-pay | Admitting: Internal Medicine

## 2024-08-05 VITALS — Ht 73.0 in | Wt 284.0 lb

## 2024-08-05 DIAGNOSIS — Z1211 Encounter for screening for malignant neoplasm of colon: Secondary | ICD-10-CM

## 2024-08-05 MED ORDER — NA SULFATE-K SULFATE-MG SULF 17.5-3.13-1.6 GM/177ML PO SOLN: 1.0000 | Freq: Once | ORAL | 0 refills | Status: AC

## 2024-08-05 NOTE — Progress Notes (Signed)
 Denies allergies to eggs or soy products. Denies complication of anesthesia or sedation. Denies use of weight loss medication. Denies use of O2.   Emmi instructions given for colonoscopy.

## 2024-08-19 ENCOUNTER — Ambulatory Visit: Admitting: Internal Medicine

## 2024-08-19 ENCOUNTER — Encounter: Payer: Self-pay | Admitting: Internal Medicine

## 2024-08-19 VITALS — BP 131/64 | HR 67 | Temp 97.6°F | Resp 13 | Ht 73.0 in | Wt 284.0 lb

## 2024-08-19 DIAGNOSIS — Z1211 Encounter for screening for malignant neoplasm of colon: Secondary | ICD-10-CM | POA: Diagnosis not present

## 2024-08-19 DIAGNOSIS — K5730 Diverticulosis of large intestine without perforation or abscess without bleeding: Secondary | ICD-10-CM

## 2024-08-19 DIAGNOSIS — D123 Benign neoplasm of transverse colon: Secondary | ICD-10-CM | POA: Diagnosis not present

## 2024-08-19 MED ORDER — SODIUM CHLORIDE 0.9 % IV SOLN: 500.0000 mL | Freq: Once | INTRAVENOUS | Status: DC

## 2024-08-19 NOTE — Progress Notes (Signed)
 Called to room to assist during endoscopic procedure.  Patient ID and intended procedure confirmed with present staff. Received instructions for my participation in the procedure from the performing physician.

## 2024-08-19 NOTE — Patient Instructions (Addendum)
Thank you for letting us take care of your healthcare needs today. Please see handouts given to you on Polyps and Diverticulosis.    YOU HAD AN ENDOSCOPIC PROCEDURE TODAY AT [facility]:   Refer to the procedure report that was given to you for any specific questions about what was found during the examination.  If the procedure report does not answer your questions, please call your gastroenterologist to clarify.  If you requested that your care partner not be given the details of your procedure findings, then the procedure report has been included in a sealed envelope for you to review at your convenience later.  YOU SHOULD EXPECT: Some feelings of bloating in the abdomen. Passage of more gas than usual.  Walking can help get rid of the air that was put into your GI tract during the procedure and reduce the bloating. If you had a lower endoscopy (such as a colonoscopy or flexible sigmoidoscopy) you may notice spotting of blood in your stool or on the toilet paper. If you underwent a bowel prep for your procedure, you may not have a normal bowel movement for a few days.  Please Note:  You might notice some irritation and congestion in your nose or some drainage.  This is from the oxygen used during your procedure.  There is no need for concern and it should clear up in a day or so.  SYMPTOMS TO REPORT IMMEDIATELY:  Following lower endoscopy (colonoscopy or flexible sigmoidoscopy):  Excessive amounts of blood in the stool  Significant tenderness or worsening of abdominal pains  Swelling of the abdomen that is new, acute  Fever of 100F or higher   For urgent or emergent issues, a gastroenterologist can be reached at any hour by calling [phone]. Do not use MyChart messaging for urgent concerns.    DIET:  We do recommend a small meal at first, but then you may proceed to your regular diet.  Drink plenty of fluids but you should avoid alcoholic beverages for 24  hours.  ACTIVITY:  You should plan to take it easy for the rest of today and you should NOT DRIVE or use heavy machinery until tomorrow (because of the sedation medicines used during the test).    FOLLOW UP: Our staff will call the number listed on your records the next business day following your procedure.  We will call around 7:15- 8:00 am to check on you and address any questions or concerns that you may have regarding the information given to you following your procedure. If we do not reach you, we will leave a message.     If any biopsies were taken you will be contacted by phone or by letter within the next 1-3 weeks.  Please call us at [phone] if you have not heard about the biopsies in 3 weeks.    SIGNATURES/CONFIDENTIALITY: You and/or your care partner have signed paperwork which will be entered into your electronic medical record.  These signatures attest to the fact that that the information above on your After Visit Summary has been reviewed and is understood.  Full responsibility of the confidentiality of this discharge information lies with you and/or your care-partner.
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
## 2024-08-19 NOTE — Op Note (Signed)
 [facility] Patient Name: Troy Morris Procedure Date: 08/19/2024 7:23 AM MRN: [id number] Endoscopist: Norleen SAILOR. Abran , MD, [id number] Age: 48 Referring MD:  Date of Birth: 03-Oct-1976 Gender: Male Account #: [id number] Procedure:                Colonoscopy with cold snare polypectomy x 1 Indications:              Screening for colorectal malignant neoplasm Medicines:                Monitored Anesthesia Care Procedure:                Pre-Anesthesia Assessment:                           - Prior to the procedure, a History and Physical                            was performed, and patient medications and                            allergies were reviewed. The patient's tolerance of                            previous anesthesia was also reviewed. The risks                            and benefits of the procedure and the sedation                            options and risks were discussed with the patient.                            All questions were answered, and informed consent                            was obtained. Prior Anticoagulants: The patient has                            taken no anticoagulant or antiplatelet agents. ASA                            Grade Assessment: II - A patient with mild systemic                            disease. After reviewing the risks and benefits,                            the patient was deemed in satisfactory condition to                            undergo the procedure.                           After obtaining informed consent, the colonoscope  was passed under direct vision. Throughout the                            procedure, the patient's blood pressure, pulse, and                            oxygen saturations were monitored continuously. The                            CF [id number] #[id number] was introduced through the anus                            and advanced to the the cecum, identified by                             appendiceal orifice and ileocecal valve. The                            ileocecal valve, appendiceal orifice, and rectum                            were photographed. The quality of the bowel                            preparation was excellent. The colonoscopy was                            performed without difficulty. The patient tolerated                            the procedure well. The bowel preparation used was                            SUPREP via split dose instruction. Scope In: 8:30:58 AM Scope Out: 8:41:19 AM Scope Withdrawal Time: 0 hours 8 minutes 56 seconds  Total Procedure Duration: 0 hours 10 minutes 21 seconds  Findings:                 A 3 mm polyp was found in the transverse colon. The                            polyp was removed with a cold snare. Resection and                            retrieval were complete.                           Multiple diverticula were found in the left colon                            and right colon.                           The exam was otherwise without abnormality on  direct and retroflexion views. Complications:            No immediate complications. Estimated blood loss:                            None. Estimated Blood Loss:     Estimated blood loss: none. Impression:               - One 3 mm polyp in the transverse colon, removed                            with a cold snare. Resected and retrieved.                           - Diverticulosis in the left colon and in the right                            colon.                           - The examination was otherwise normal on direct                            and retroflexion views. Recommendation:           - Repeat colonoscopy in 7-10 years for surveillance.                           - Patient has a contact number available for                            emergencies. The signs and symptoms of potential                            delayed  complications were discussed with the                            patient. Return to normal activities tomorrow.                            Written discharge instructions were provided to the                            patient.                           - Resume previous diet.                           - Continue present medications.                           - Await pathology results. Norleen SAILOR. Abran, MD 08/19/2024 8:54:35 AM This report has been signed electronically.

## 2024-08-19 NOTE — Progress Notes (Signed)
 Pt's states no medical or surgical changes since previsit or office visit.

## 2024-08-19 NOTE — Progress Notes (Signed)
 To PACU via stretcher, sedated, good respiratory effort, VSS.

## 2024-08-20 ENCOUNTER — Encounter: Payer: Self-pay | Admitting: Nurse Practitioner

## 2024-08-20 ENCOUNTER — Ambulatory Visit: Admitting: Nurse Practitioner

## 2024-08-20 ENCOUNTER — Telehealth: Payer: Self-pay

## 2024-08-20 VITALS — BP 130/80 | HR 87 | Temp 99.8°F | Ht 73.0 in | Wt 284.6 lb

## 2024-08-20 DIAGNOSIS — Z79899 Other long term (current) drug therapy: Secondary | ICD-10-CM

## 2024-08-20 DIAGNOSIS — E069 Thyroiditis, unspecified: Secondary | ICD-10-CM

## 2024-08-20 DIAGNOSIS — Z6837 Body mass index (BMI) 37.0-37.9, adult: Secondary | ICD-10-CM

## 2024-08-20 DIAGNOSIS — Z0000 Encounter for general adult medical examination without abnormal findings: Secondary | ICD-10-CM | POA: Diagnosis not present

## 2024-08-20 DIAGNOSIS — M25552 Pain in left hip: Secondary | ICD-10-CM

## 2024-08-20 DIAGNOSIS — K429 Umbilical hernia without obstruction or gangrene: Secondary | ICD-10-CM

## 2024-08-20 DIAGNOSIS — Z136 Encounter for screening for cardiovascular disorders: Secondary | ICD-10-CM

## 2024-08-20 DIAGNOSIS — I10 Essential (primary) hypertension: Secondary | ICD-10-CM

## 2024-08-20 DIAGNOSIS — I872 Venous insufficiency (chronic) (peripheral): Secondary | ICD-10-CM | POA: Diagnosis not present

## 2024-08-20 DIAGNOSIS — Z13220 Encounter for screening for lipoid disorders: Secondary | ICD-10-CM

## 2024-08-20 DIAGNOSIS — Z139 Encounter for screening, unspecified: Secondary | ICD-10-CM

## 2024-08-20 DIAGNOSIS — E6609 Other obesity due to excess calories: Secondary | ICD-10-CM

## 2024-08-20 DIAGNOSIS — E66812 Obesity, class 2: Secondary | ICD-10-CM

## 2024-08-20 DIAGNOSIS — Z13228 Encounter for screening for other metabolic disorders: Secondary | ICD-10-CM

## 2024-08-20 LAB — POCT URINALYSIS DIP (CLINITEK)
Bilirubin, UA: NEGATIVE
Blood, UA: NEGATIVE
Glucose, UA: NEGATIVE mg/dL
Ketones, POC UA: NEGATIVE mg/dL
Leukocytes, UA: NEGATIVE
Nitrite, UA: NEGATIVE
POC PROTEIN,UA: NEGATIVE
Spec Grav, UA: >=1.03 — AB (ref 1.010–1.025)
Urobilinogen, UA: 1 U/dL
pH, UA: 6 (ref 5.0–8.0)

## 2024-08-20 NOTE — Progress Notes (Signed)
 LILLETTE Kristeen JINNY Gladis, CMA,acting as a Neurosurgeon for Troy Ada, FNP.,have documented all relevant documentation on the behalf of Troy Ada, FNP,as directed by  Troy Ada, FNP while in the presence of Troy Ada, FNP.  Subjective:   Patient ID: Troy Morris , male    DOB: 09-01-1976 , 48 y.o.   MRN: [id number]  Chief Complaint  Patient presents with   Annual Exam    Patient presents today for HM, Patient reports compliance with medication. Patient denies any chest pain, SOB, or headaches. Patient has no concerns today.       HPI  Discussed the use of AI scribe software for clinical note transcription with the patient, who gave verbal consent to proceed.  History of Present Illness Troy Morris is a 48 year old male who presents for an annual physical exam.  He recently underwent a colonoscopy and a CT scan which revealed a small hernia. He experiences occasional stomach discomfort, which he attributes to the hernia.  He has a history of bleeding, but recent evaluations, including a colonoscopy, did not reveal hemorrhoids or other sources of bleeding. He is unsure of the cause and plans to discuss this further with his gastroenterologist.  He experiences occasional swelling in his feet and ankles, and acknowledges eating out frequently, which may contribute to his salt intake. He acknowledges eating out frequently, which may contribute to his salt intake. He has not been exercising regularly, which he believes affects his symptoms.  He describes pain in his left hip and leg, characterized as aching and sharp, particularly when moving his leg in certain ways. He occasionally uses ibuprofen for pain relief but has not tried other pain management strategies. He notes that stretching and increased physical activity may help alleviate his symptoms.  He reports throbbing in his feet, particularly at night, and suspects it may be related to blood flow issues. He has not previously seen a  vascular provider.  He has not been using compression socks but acknowledges that increased physical activity previously helped reduce his symptoms.  He has a family history of prostate cancer, with his brother recently diagnosed and treated for the condition. He had a PSA test in July, which was normal.   Past Medical History:  Diagnosis Date   Acute diastolic CHF (congestive heart failure) (HCC)    CHF (congestive heart failure) (HCC) 03/21/2016   Fluid overload 03/22/2016   GERD (gastroesophageal reflux disease)    Heart murmur    Hypertension    LV dysfunction 03/23/2016   Sleep apnea    Thyroid  disease      Family History  Problem Relation Age of Onset   Cancer Mother    Diabetes Mother    Prostate cancer Brother    Cancer Brother    CAD Neg Hx    Stroke Neg Hx    Clotting disorder Neg Hx    Colon cancer Neg Hx    Esophageal cancer Neg Hx    Rectal cancer Neg Hx    Stomach cancer Neg Hx      Current Outpatient Medications:    ibuprofen (ADVIL,MOTRIN) 200 MG tablet, Take 200 mg by mouth every 6 (six) hours as needed for headache., Disp: , Rfl:    methimazole  (TAPAZOLE ) 10 MG tablet, Take 1 tablet (10 mg total) by mouth daily., Disp: 90 tablet, Rfl: 3   metoprolol  succinate (TOPROL -XL) 50 MG 24 hr tablet, Take 1 tablet (50 mg total) by mouth daily., Disp: 60 tablet, Rfl:  1   ramipril  (ALTACE ) 2.5 MG capsule, Take 1 capsule (2.5 mg total) by mouth daily., Disp: 30 capsule, Rfl: 2   triamterene -hydrochlorothiazide (MAXZIDE-25) 37.5-25 MG tablet, Take 0.5 tablets by mouth daily., Disp: 15 tablet, Rfl: 3   Vitamin D , Ergocalciferol , (DRISDOL ) 1.25 MG (50000 UNIT) CAPS capsule, Take 1 capsule (50,000 Units total) by mouth every 7 (seven) days., Disp: 12 capsule, Rfl: 1   zinc gluconate 50 MG tablet, Take 50 mg by mouth daily., Disp: , Rfl:    No Known Allergies   Men's preventive visit. Patient Health Questionnaire (PHQ-2) is  Flowsheet Row Office Visit from 05/14/2024 in  [facility]  PHQ-2 Total Score 0  . Patient is on a Regular diet; he has stopped drinking sodas and is now eating more fruit. Exercise - none.  Marital status: Married. Relevant history for alcohol use is:  Social History   Substance and Sexual Activity  Alcohol Use No  Relevant history for tobacco use is:  Social History   Tobacco Use  Smoking Status Never  Smokeless Tobacco Never    Review of Systems  Constitutional: Negative.   Respiratory: Negative.    Cardiovascular: Negative.   Gastrointestinal:  Positive for abdominal pain.  Musculoskeletal: Negative.   Neurological: Negative.   Psychiatric/Behavioral: Negative.       Today's Vitals   08/20/24 1455 08/20/24 1605  BP: (!) 140/80 130/80  Pulse: 87   Temp: 99.8 F (37.7 C)   TempSrc: Oral   Weight: 284 lb 9.6 oz (129.1 kg)   Height: 6' 1 (1.854 m)   PainSc: 0-No pain    Body mass index is 37.55 kg/m.  Wt Readings from Last 3 Encounters:  08/20/24 284 lb 9.6 oz (129.1 kg)  08/19/24 284 lb (128.8 kg)  08/05/24 284 lb (128.8 kg)    Objective:  Physical Exam Vitals and nursing note reviewed.  Constitutional:      General: He is not in acute distress.    Appearance: Normal appearance. He is obese.  HENT:     Head: Normocephalic.  Cardiovascular:     Rate and Rhythm: Normal rate and regular rhythm.     Pulses: Normal pulses.     Heart sounds: Normal heart sounds. No murmur heard. Pulmonary:     Effort: Pulmonary effort is normal. No respiratory distress.     Breath sounds: Normal breath sounds. No wheezing.  Abdominal:     General: Abdomen is flat.     Palpations: Abdomen is soft.     Hernia: A hernia is present.  Musculoskeletal:        General: Normal range of motion.  Skin:    General: Skin is warm and dry.     Capillary Refill: Capillary refill takes less than 2 seconds.  Neurological:     General: No focal deficit present.     Mental Status: He is alert and  oriented to person, place, and time.  Psychiatric:        Mood and Affect: Mood normal.        Behavior: Behavior normal.        Thought Content: Thought content normal.        Judgment: Judgment normal.      Assessment And Plan:    Encounter for annual health examination Assessment & Plan: Behavior modifications discussed and diet history reviewed.   Pt will continue to exercise regularly and modify diet with low GI, plant based foods and decrease intake  of processed foods.  Recommend intake of daily multivitamin, Vitamin D , and calcium.  Recommend colonoscopy for preventive screenings, as well as recommend immunizations that include influenza, TDAP, and Shingles    Encounter for screening -     Hepatitis B surface antibody,qualitative  Encounter for screening for metabolic disorder -     Hemoglobin A1c  Encounter for lipid screening for cardiovascular disease -     Lipid panel  Class 2 obesity due to excess calories with body mass index (BMI) of 37.0 to 37.9 in adult, unspecified whether serious comorbidity present Assessment & Plan: Class 2 obesity due to excess caloric intake. No recent weight change despite dietary modifications. - Continue dietary modifications. - Encourage regular physical activity.   Benign essential HTN Assessment & Plan: - Recheck blood pressure during visit. Continue current medications  Orders: -     POCT URINALYSIS DIP (CLINITEK) -     Microalbumin / creatinine urine ratio -     CMP14+EGFR  Hyperthyroiditis -     TSH + free T4  Venous reflux Assessment & Plan: Chronic venous insufficiency with backflow and foot throbbing. No recent vascular specialist consultation. - Refer to vascular specialist. - Recommend compression socks.  Orders: -     Ambulatory referral to Vascular Surgery  Umbilical hernia without obstruction and without gangrene Assessment & Plan: Small hernia identified on CT, currently asymptomatic without  complications. Prefers weight loss over surgery. - Encouraged weight loss to reduce hernia size. - Advised monitoring for increased pain or firmness, which would require surgical consultation.   Other long term (current) drug therapy -     CBC with Differential/Platelet  Left hip pain Assessment & Plan: Intermittent pain in left hip and leg, possibly osteoarthritis-related. No significant pain on palpation. - Recommend stretching exercises. - Suggest trial of topical pain relief gel, such as Vytorin.    Return for 1 year physical, 6 month bp check. Patient was given opportunity to ask questions. Patient verbalized understanding of the plan and was able to repeat key elements of the plan. All questions were answered to their satisfaction.   Troy Ada, FNP  I, Troy Ada, FNP, have reviewed all documentation for this visit. The documentation on 08/20/24 for the exam, diagnosis, procedures, and orders are all accurate and complete.

## 2024-08-20 NOTE — Telephone Encounter (Signed)
  Follow up Call-     08/19/2024    7:29 AM  Call back number  Post procedure Call Back phone  # [phone]  Permission to leave phone message Yes     Patient questions:  Do you have a fever, pain , or abdominal swelling? No. Pain Score  0 *  Have you tolerated food without any problems? Yes.    Have you been able to return to your normal activities? Yes.    Do you have any questions about your discharge instructions: Diet   No. Medications  No. Follow up visit  No.  Do you have questions or concerns about your Care? No.  Actions: * If pain score is 4 or above: No action needed, pain <4.

## 2024-08-21 ENCOUNTER — Ambulatory Visit: Payer: Self-pay | Admitting: Internal Medicine

## 2024-08-21 LAB — CBC WITH DIFFERENTIAL/PLATELET
Basophils Absolute: 0 x10E3/uL (ref 0.0–0.2)
Basos: 0 %
EOS (ABSOLUTE): 0.1 x10E3/uL (ref 0.0–0.4)
Eos: 1 %
Hematocrit: 46.9 % (ref 37.5–51.0)
Hemoglobin: 15 g/dL (ref 13.0–17.7)
Immature Grans (Abs): 0 x10E3/uL (ref 0.0–0.1)
Immature Granulocytes: 0 %
Lymphocytes Absolute: 1.7 x10E3/uL (ref 0.7–3.1)
Lymphs: 24 %
MCH: 28 pg (ref 26.6–33.0)
MCHC: 32 g/dL (ref 31.5–35.7)
MCV: 88 fL (ref 79–97)
Monocytes Absolute: 0.7 x10E3/uL (ref 0.1–0.9)
Monocytes: 9 %
Neutrophils Absolute: 4.8 x10E3/uL (ref 1.4–7.0)
Neutrophils: 66 %
Platelets: 235 x10E3/uL (ref 150–450)
RBC: 5.36 x10E6/uL (ref 4.14–5.80)
RDW: 14.2 % (ref 11.6–15.4)
WBC: 7.3 x10E3/uL (ref 3.4–10.8)

## 2024-08-21 LAB — CMP14+EGFR
ALT: 14 IU/L (ref 0–44)
AST: 18 IU/L (ref 0–40)
Albumin: 4.2 g/dL (ref 4.1–5.1)
Alkaline Phosphatase: 95 IU/L (ref 47–123)
BUN/Creatinine Ratio: 10 (ref 9–20)
BUN: 13 mg/dL (ref 6–24)
Bilirubin Total: 1.1 mg/dL (ref 0.0–1.2)
CO2: 24 mmol/L (ref 20–29)
Calcium: 9.4 mg/dL (ref 8.7–10.2)
Chloride: 104 mmol/L (ref 96–106)
Creatinine, Ser: 1.29 mg/dL — ABNORMAL HIGH (ref 0.76–1.27)
Globulin, Total: 3.1 g/dL (ref 1.5–4.5)
Glucose: 77 mg/dL (ref 70–99)
Potassium: 4.1 mmol/L (ref 3.5–5.2)
Sodium: 142 mmol/L (ref 134–144)
Total Protein: 7.3 g/dL (ref 6.0–8.5)
eGFR: 68 mL/min/1.73 (ref >59)

## 2024-08-21 LAB — HEMOGLOBIN A1C
Est. average glucose Bld gHb Est-mCnc: 108 mg/dL
Hgb A1c MFr Bld: 5.4 % (ref 4.8–5.6)

## 2024-08-21 LAB — MICROALBUMIN / CREATININE URINE RATIO
Creatinine, Urine: 195.8 mg/dL
Microalb/Creat Ratio: 3 mg/g{creat} (ref 0–29)
Microalbumin, Urine: 5.5 ug/mL

## 2024-08-21 LAB — TSH+FREE T4
Free T4: 0.99 ng/dL (ref 0.82–1.77)
TSH: 1.25 u[IU]/mL (ref 0.450–4.500)

## 2024-08-21 LAB — SURGICAL PATHOLOGY

## 2024-08-21 LAB — LIPID PANEL
Chol/HDL Ratio: 2.8 ratio (ref 0.0–5.0)
Cholesterol, Total: 162 mg/dL (ref 100–199)
HDL: 57 mg/dL (ref >39)
LDL Chol Calc (NIH): 88 mg/dL (ref 0–99)
Triglycerides: 90 mg/dL (ref 0–149)
VLDL Cholesterol Cal: 17 mg/dL (ref 5–40)

## 2024-08-21 LAB — HEPATITIS B SURFACE ANTIBODY,QUALITATIVE: Hep B Surface Ab, Qual: NONREACTIVE

## 2024-09-01 ENCOUNTER — Ambulatory Visit: Payer: Self-pay | Admitting: Nurse Practitioner

## 2024-09-01 DIAGNOSIS — M25552 Pain in left hip: Secondary | ICD-10-CM | POA: Insufficient documentation

## 2024-09-01 NOTE — Assessment & Plan Note (Signed)
 Chronic venous insufficiency with backflow and foot throbbing. No recent vascular specialist consultation. - Refer to vascular specialist. - Recommend compression socks.

## 2024-09-01 NOTE — Assessment & Plan Note (Signed)
 Small hernia identified on CT, currently asymptomatic without complications. Prefers weight loss over surgery. - Encouraged weight loss to reduce hernia size. - Advised monitoring for increased pain or firmness, which would require surgical consultation.

## 2024-09-01 NOTE — Assessment & Plan Note (Signed)
-   Recheck blood pressure during visit. Continue current medications

## 2024-09-01 NOTE — Assessment & Plan Note (Signed)
 Class 2 obesity due to excess caloric intake. No recent weight change despite dietary modifications. - Continue dietary modifications. - Encourage regular physical activity.

## 2024-09-01 NOTE — Assessment & Plan Note (Signed)
 Behavior modifications discussed and diet history reviewed.   Pt will continue to exercise regularly and modify diet with low GI, plant based foods and decrease intake of processed foods.  Recommend intake of daily multivitamin, Vitamin D, and calcium.  Recommend colonoscopy for preventive screenings, as well as recommend immunizations that include influenza, TDAP, and Shingles

## 2024-09-01 NOTE — Assessment & Plan Note (Signed)
 Intermittent pain in left hip and leg, possibly osteoarthritis-related. No significant pain on palpation. - Recommend stretching exercises. - Suggest trial of topical pain relief gel, such as Vytorin.

## 2024-11-12 ENCOUNTER — Ambulatory Visit: Admitting: Internal Medicine

## 2024-12-02 ENCOUNTER — Ambulatory Visit

## 2024-12-15 ENCOUNTER — Other Ambulatory Visit: Payer: Self-pay | Admitting: Nurse Practitioner

## 2025-01-07 ENCOUNTER — Encounter: Admitting: Vascular Surgery

## 2025-02-18 ENCOUNTER — Ambulatory Visit: Payer: Self-pay | Admitting: Nurse Practitioner

## 2025-08-25 ENCOUNTER — Encounter: Payer: Self-pay | Admitting: Nurse Practitioner

## 2025-08-26 ENCOUNTER — Encounter: Payer: Self-pay | Admitting: Nurse Practitioner
